# Patient Record
Sex: Female | Born: 1970 | Race: Black or African American | Hispanic: No | Marital: Single | Smoking: Current every day smoker
Health system: Southern US, Community
[De-identification: ages and names within clinical notes are randomized; demographics above are authoritative.]

## PROBLEM LIST (undated history)

## (undated) ENCOUNTER — Ambulatory Visit: Payer: Self-pay

## (undated) DIAGNOSIS — N921 Excessive and frequent menstruation with irregular cycle: Secondary | ICD-10-CM

## (undated) DIAGNOSIS — R030 Elevated blood-pressure reading, without diagnosis of hypertension: Secondary | ICD-10-CM

## (undated) DIAGNOSIS — G43909 Migraine, unspecified, not intractable, without status migrainosus: Secondary | ICD-10-CM

## (undated) DIAGNOSIS — L0292 Furuncle, unspecified: Secondary | ICD-10-CM

## (undated) DIAGNOSIS — K649 Unspecified hemorrhoids: Secondary | ICD-10-CM

## (undated) DIAGNOSIS — Z30017 Encounter for initial prescription of implantable subdermal contraceptive: Principal | ICD-10-CM

## (undated) DIAGNOSIS — D649 Anemia, unspecified: Secondary | ICD-10-CM

## (undated) DIAGNOSIS — A599 Trichomoniasis, unspecified: Secondary | ICD-10-CM

## (undated) DIAGNOSIS — R894 Abnormal immunological findings in specimens from other organs, systems and tissues: Principal | ICD-10-CM

## (undated) DIAGNOSIS — Z975 Presence of (intrauterine) contraceptive device: Secondary | ICD-10-CM

## (undated) DIAGNOSIS — Z3009 Encounter for other general counseling and advice on contraception: Principal | ICD-10-CM

## (undated) HISTORY — DX: Excessive and frequent menstruation with irregular cycle: N92.1

## (undated) HISTORY — DX: Furuncle, unspecified: L02.92

## (undated) HISTORY — DX: Encounter for other general counseling and advice on contraception: Z30.09

## (undated) HISTORY — DX: Elevated blood-pressure reading, without diagnosis of hypertension: R03.0

## (undated) HISTORY — DX: Anemia, unspecified: D64.9

## (undated) HISTORY — PX: MOUTH SURGERY: SHX715

## (undated) HISTORY — DX: Encounter for initial prescription of implantable subdermal contraceptive: Z30.017

## (undated) HISTORY — DX: Abnormal immunological findings in specimens from other organs, systems and tissues: R89.4

## (undated) HISTORY — DX: Trichomoniasis, unspecified: A59.9

## (undated) HISTORY — DX: Presence of (intrauterine) contraceptive device: Z97.5

## (undated) HISTORY — PX: OTHER SURGICAL HISTORY: SHX169

---

## 2007-08-24 ENCOUNTER — Emergency Department (HOSPITAL_COMMUNITY): Admission: EM | Admit: 2007-08-24 | Discharge: 2007-08-24 | Payer: Self-pay | Admitting: Emergency Medicine

## 2011-06-14 LAB — CBC
HCT: 33.6 — ABNORMAL LOW
Hemoglobin: 11 — ABNORMAL LOW
WBC: 5.4

## 2011-06-14 LAB — DIFFERENTIAL
Eosinophils Relative: 1
Lymphocytes Relative: 30
Lymphs Abs: 1.6
Monocytes Absolute: 0.5

## 2011-06-14 LAB — URINALYSIS, ROUTINE W REFLEX MICROSCOPIC
Bilirubin Urine: NEGATIVE
Nitrite: NEGATIVE
Specific Gravity, Urine: 1.01
pH: 5.5

## 2011-06-14 LAB — HCG, QUANTITATIVE, PREGNANCY: hCG, Beta Chain, Quant, S: 327 — ABNORMAL HIGH

## 2011-06-14 LAB — PREGNANCY, URINE: Preg Test, Ur: POSITIVE

## 2011-06-14 LAB — URINE MICROSCOPIC-ADD ON

## 2011-06-14 LAB — WET PREP, GENITAL: Yeast Wet Prep HPF POC: NONE SEEN

## 2011-09-07 ENCOUNTER — Emergency Department (HOSPITAL_COMMUNITY)
Admission: EM | Admit: 2011-09-07 | Discharge: 2011-09-07 | Disposition: A | Discharge status: Home / Self Care | Payer: Self-pay | Attending: Emergency Medicine | Admitting: Emergency Medicine

## 2011-09-07 DIAGNOSIS — F17200 Nicotine dependence, unspecified, uncomplicated: Secondary | ICD-10-CM | POA: Insufficient documentation

## 2011-09-07 DIAGNOSIS — L0291 Cutaneous abscess, unspecified: Secondary | ICD-10-CM

## 2011-09-07 DIAGNOSIS — L03319 Cellulitis of trunk, unspecified: Secondary | ICD-10-CM | POA: Insufficient documentation

## 2011-09-07 DIAGNOSIS — L02219 Cutaneous abscess of trunk, unspecified: Secondary | ICD-10-CM | POA: Insufficient documentation

## 2011-09-07 DIAGNOSIS — R109 Unspecified abdominal pain: Secondary | ICD-10-CM | POA: Insufficient documentation

## 2011-09-07 MED ORDER — OXYCODONE-ACETAMINOPHEN 5-325 MG PO TABS: 1.0000 | ORAL_TABLET | Freq: Four times a day (QID) | ORAL | Status: AC | PRN

## 2011-09-07 MED ORDER — PROMETHAZINE HCL 12.5 MG PO TABS
12.5000 mg | ORAL_TABLET | Freq: Once | ORAL | Status: AC
  Administered 2011-09-07: 12.5 mg via ORAL
  Filled 2011-09-07: qty 1

## 2011-09-07 MED ORDER — IBUPROFEN 800 MG PO TABS
800.0000 mg | ORAL_TABLET | Freq: Once | ORAL | Status: AC
  Administered 2011-09-07: 800 mg via ORAL
  Filled 2011-09-07: qty 1

## 2011-09-07 MED ORDER — SULFAMETHOXAZOLE-TMP DS 800-160 MG PO TABS
1.0000 | ORAL_TABLET | Freq: Once | ORAL | Status: AC
  Administered 2011-09-07: 1 via ORAL
  Filled 2011-09-07: qty 1

## 2011-09-07 MED ORDER — HYDROCODONE-ACETAMINOPHEN 5-325 MG PO TABS
2.0000 | ORAL_TABLET | Freq: Once | ORAL | Status: AC
  Administered 2011-09-07: 2 via ORAL
  Filled 2011-09-07: qty 2

## 2011-09-07 MED ORDER — AMOXICILLIN-POT CLAVULANATE 875-125 MG PO TABS
1.0000 | ORAL_TABLET | Freq: Once | ORAL | Status: AC
  Administered 2011-09-07: 1 via ORAL
  Filled 2011-09-07: qty 1

## 2011-09-07 MED ORDER — SULFAMETHOXAZOLE-TRIMETHOPRIM 800-160 MG PO TABS: ORAL_TABLET | ORAL | Status: DC

## 2011-09-07 MED ORDER — BUPIVACAINE HCL (PF) 0.5 % IJ SOLN
INTRAMUSCULAR | Status: AC
  Administered 2011-09-07: 150 mg
  Filled 2011-09-07: qty 30

## 2011-09-07 NOTE — ED Provider Notes (Signed)
History     CSN: [id number]  Arrival date & time 09/07/11  1535   First MD Initiated Contact with Patient 09/07/11 1736      Chief Complaint  Patient presents with  . Abscess    (Consider location/radiation/quality/duration/timing/severity/associated sxs/prior treatment) Patient is a 40 y.o. female presenting with abscess. The history is provided by the patient.  Abscess  This is a new problem. The current episode started less than one week ago. The onset was gradual. The problem has been gradually worsening. Affected Location: Pubis. The abscess is characterized by redness and painfulness. It is unknown what she was exposed to. Pertinent negatives include no fever, no vomiting and no cough. There were no sick contacts. She has received no recent medical care.    History reviewed. No pertinent past medical history.  History reviewed. No pertinent past surgical history.  No family history on file.  History  Substance Use Topics  . Smoking status: Current Everyday Smoker -- 0.5 packs/day  . Smokeless tobacco: Not on file  . Alcohol Use: Yes     occ    OB History    Grav Para Term Preterm Abortions TAB SAB Ect Mult Living                  Review of Systems  Constitutional: Negative for fever and activity change.       All ROS Neg except as noted in HPI  HENT: Negative for nosebleeds and neck pain.   Eyes: Negative for photophobia and discharge.  Respiratory: Negative for cough, shortness of breath and wheezing.   Cardiovascular: Negative for chest pain and palpitations.  Gastrointestinal: Negative for vomiting, abdominal pain and blood in stool.  Genitourinary: Negative for dysuria, frequency and hematuria.  Musculoskeletal: Negative for back pain and arthralgias.  Skin: Negative.        Abscess  Neurological: Negative for dizziness, seizures and speech difficulty.  Psychiatric/Behavioral: Negative for hallucinations and confusion.    Allergies  Review of  patient's allergies indicates no known allergies.  Home Medications  No current outpatient prescriptions on file.  BP 140/93  Pulse 93  Temp(Src) 98.4 F (36.9 C) (Oral)  Resp 20  Ht 5\' 8"  (1.727 m)  Wt 118 lb (53.524 kg)  BMI 17.94 kg/m2  SpO2 100%  LMP 08/22/2011  Physical Exam  Nursing note and vitals reviewed. Constitutional: She is oriented to person, place, and time. She appears well-developed and well-nourished.  Non-toxic appearance.  HENT:  Head: Normocephalic.  Right Ear: Tympanic membrane and external ear normal.  Left Ear: Tympanic membrane and external ear normal.  Eyes: EOM and lids are normal. Pupils are equal, round, and reactive to light.  Neck: Normal range of motion. Neck supple. Carotid bruit is not present.  Cardiovascular: Normal rate, regular rhythm, normal heart sounds, intact distal pulses and normal pulses.   Pulmonary/Chest: Breath sounds normal. No respiratory distress.  Abdominal: Soft. Bowel sounds are normal. There is no tenderness. There is no guarding.  Genitourinary:       Moderate size abscess of the left pubis adjacent to the labia. Minimal drainage present. Mild to moderate redness, without streaking. No abscess involvement of the vaginal vault.  Musculoskeletal: Normal range of motion.  Lymphadenopathy:       Head (right side): No submandibular adenopathy present.       Head (left side): No submandibular adenopathy present.    She has no cervical adenopathy.  Neurological: She is alert and oriented to person,  place, and time. She has normal strength. No cranial nerve deficit or sensory deficit.  Skin: Skin is warm and dry.  Psychiatric: She has a normal mood and affect. Her speech is normal.    ED Course  Procedures (including critical care time)   Labs Reviewed  CULTURE, ROUTINE-ABSCESS   pulse oximetry 100% on room air. Within normal limits by my interpretation. No results found.   Dx: Abscess left pubis   MDM  I have  reviewed nursing notes, vital signs, and all appropriate lab and imaging results for this patient. Patient advised to remove packing on December 30. Prescription for Septra and Percocet given to the patient. Patient to return if any changes, problems, or concerns.       Kathie Dike, [place] 09/07/11 [phone]

## 2011-09-07 NOTE — ED Notes (Signed)
Pt presents with abscess to left side of groin. Pt states it started 1 week ago.

## 2011-09-07 NOTE — ED Notes (Signed)
Pt presents with a large abscess on the left labia/groin. Pt states has been "growing x 5 days", has tried warm compresses and boil ease without success." Pt states pain level is 10/10. Large red and hardened area noted on left groin and into the labia.

## 2011-09-08 NOTE — ED Provider Notes (Signed)
Medical screening examination/treatment/procedure(s) were performed by non-physician practitioner and as supervising physician I was immediately available for consultation/collaboration.  Britney Captain R. Jamire Shabazz, MD 09/08/11 0052 

## 2011-09-11 LAB — CULTURE, ROUTINE-ABSCESS

## 2011-09-13 ENCOUNTER — Emergency Department (HOSPITAL_COMMUNITY): Payer: Self-pay

## 2011-09-13 ENCOUNTER — Encounter (HOSPITAL_COMMUNITY): Payer: Self-pay | Admitting: *Deleted

## 2011-09-13 ENCOUNTER — Emergency Department (HOSPITAL_COMMUNITY)
Admission: EM | Admit: 2011-09-13 | Discharge: 2011-09-13 | Disposition: A | Discharge status: Home / Self Care | Payer: Self-pay | Attending: Emergency Medicine | Admitting: Emergency Medicine

## 2011-09-13 DIAGNOSIS — R109 Unspecified abdominal pain: Secondary | ICD-10-CM | POA: Insufficient documentation

## 2011-09-13 DIAGNOSIS — Z136 Encounter for screening for cardiovascular disorders: Secondary | ICD-10-CM | POA: Insufficient documentation

## 2011-09-13 DIAGNOSIS — R05 Cough: Secondary | ICD-10-CM | POA: Insufficient documentation

## 2011-09-13 DIAGNOSIS — K625 Hemorrhage of anus and rectum: Secondary | ICD-10-CM | POA: Insufficient documentation

## 2011-09-13 DIAGNOSIS — K921 Melena: Secondary | ICD-10-CM | POA: Insufficient documentation

## 2011-09-13 DIAGNOSIS — F17200 Nicotine dependence, unspecified, uncomplicated: Secondary | ICD-10-CM | POA: Insufficient documentation

## 2011-09-13 DIAGNOSIS — R059 Cough, unspecified: Secondary | ICD-10-CM | POA: Insufficient documentation

## 2011-09-13 LAB — URINALYSIS, ROUTINE W REFLEX MICROSCOPIC
Bilirubin Urine: NEGATIVE
Glucose, UA: NEGATIVE mg/dL
Hgb urine dipstick: NEGATIVE
Ketones, ur: NEGATIVE mg/dL
Leukocytes, UA: NEGATIVE
Nitrite: NEGATIVE
Protein, ur: NEGATIVE mg/dL
Specific Gravity, Urine: 1.025 (ref 1.005–1.030)
Urobilinogen, UA: 0.2 mg/dL (ref 0.0–1.0)
pH: 6.5 (ref 5.0–8.0)

## 2011-09-13 LAB — CBC
HCT: 31.7 % — ABNORMAL LOW (ref 36.0–46.0)
MCH: 24.7 pg — ABNORMAL LOW (ref 26.0–34.0)
MCV: 76 fL — ABNORMAL LOW (ref 78.0–100.0)
RBC: 4.17 MIL/uL (ref 3.87–5.11)
WBC: 6.9 10*3/uL (ref 4.0–10.5)

## 2011-09-13 LAB — BASIC METABOLIC PANEL WITH GFR
BUN: 11 mg/dL (ref 6–23)
CO2: 24 meq/L (ref 19–32)
Calcium: 10 mg/dL (ref 8.4–10.5)
Chloride: 103 meq/L (ref 96–112)
Creatinine, Ser: 0.74 mg/dL (ref 0.50–1.10)
GFR calc Af Amer: >90 mL/min
GFR calc non Af Amer: >90 mL/min
Glucose, Bld: 90 mg/dL (ref 70–99)
Potassium: 3.9 meq/L (ref 3.5–5.1)
Sodium: 133 meq/L — ABNORMAL LOW (ref 135–145)

## 2011-09-13 LAB — HEPATIC FUNCTION PANEL
ALT: 16 U/L (ref 0–35)
Alkaline Phosphatase: 75 U/L (ref 39–117)
Bilirubin, Direct: <0.1 mg/dL (ref 0.0–0.3)
Total Bilirubin: 0.3 mg/dL (ref 0.3–1.2)

## 2011-09-13 LAB — LIPASE, BLOOD: Lipase: 187 U/L — ABNORMAL HIGH (ref 11–59)

## 2011-09-13 MED ORDER — SODIUM CHLORIDE 0.9 % IV BOLUS (SEPSIS)
250.0000 mL | Freq: Once | INTRAVENOUS | Status: AC
  Administered 2011-09-13: 14:00:00 via INTRAVENOUS

## 2011-09-13 MED ORDER — IOHEXOL 300 MG/ML  SOLN
100.0000 mL | Freq: Once | INTRAMUSCULAR | Status: AC | PRN
  Administered 2011-09-13: 100 mL via INTRAVENOUS

## 2011-09-13 MED ORDER — SODIUM CHLORIDE 0.9 % IV SOLN: INTRAVENOUS | Status: DC

## 2011-09-13 MED ORDER — ONDANSETRON HCL 4 MG/2ML IJ SOLN
4.0000 mg | Freq: Once | INTRAMUSCULAR | Status: AC
Start: 2011-09-13 — End: 2011-09-13
  Administered 2011-09-13: 4 mg via INTRAVENOUS
  Filled 2011-09-13: qty 2

## 2011-09-13 NOTE — ED Notes (Signed)
Pt states she has always had a problem with constipation. States  Her last normal bm was last saturday

## 2011-09-13 NOTE — ED Provider Notes (Addendum)
Scribed for Shelda Jakes, MD, the patient was seen in room [facility]18[facility] . This chart was scribed by Ellie Lunch.    CSN: [id number]  Arrival date & time 09/13/11  1211   First MD Initiated Contact with Patient 09/13/11 1250      Chief Complaint  Patient presents with  . Rectal Bleeding    (Consider location/radiation/quality/duration/timing/severity/associated sxs/prior treatment) HPI Sheila Berg is a 41 y.o. female who presents to the Emergency Department complaining of 1 episode of rectal bleeding this morning. Pt went to have a BM this morning and says the toilet bowl filled with bright red blood. Pt has not had any BM since. Pt denies any pain, vaginal bleeding or hematura. This problem is recurrent.  Pt says she has rectal bleeding once a month the past year. Bleeding typically lasts all day and then resolves. Pt has not seen a Dr for rectal bleeding before.  Denies HA, SOB, neck pain, chest pain, acute back pain, or rash.    History reviewed. No pertinent past medical history.  History reviewed. No pertinent past surgical history.  No family history on file.  History  Substance Use Topics  . Smoking status: Current Everyday Smoker -- 0.5 packs/day  . Smokeless tobacco: Not on file  . Alcohol Use: Yes     occ    Review of Systems  Constitutional: Negative for fever and chills.       10 Systems reviewed and are negative for acute change except as noted in the HPI.  HENT: Negative for congestion and neck pain.   Eyes: Negative for discharge and redness.  Respiratory: Negative for cough and shortness of breath.   Cardiovascular: Negative for chest pain.  Gastrointestinal: Positive for blood in stool. Negative for nausea, vomiting, abdominal pain, diarrhea and rectal pain.  Genitourinary: Negative for dysuria, hematuria and vaginal bleeding.  Musculoskeletal: Negative for back pain.  Skin: Negative for rash.  Neurological: Negative for syncope and headaches.    Psychiatric/Behavioral: Negative for hallucinations and confusion.  All other systems reviewed and are negative.    Allergies  Review of patient's allergies indicates no known allergies.  Home Medications   Current Outpatient Rx  Name Route Sig Dispense Refill  . OXYCODONE-ACETAMINOPHEN 5-325 MG PO TABS Oral Take 1 tablet by mouth every 6 (six) hours as needed for pain. 20 tablet 0  . SULFAMETHOXAZOLE-TRIMETHOPRIM 800-160 MG PO TABS  1 by mouth twice a day with food 14 tablet 0    BP 119/81  Pulse 80  Temp(Src) 99 F (37.2 C) (Oral)  Resp 20  Ht 5\' 8"  (1.727 m)  Wt 120 lb (54.432 kg)  BMI 18.25 kg/m2  SpO2 100%  LMP 08/22/2011  Physical Exam  Nursing note and vitals reviewed. Constitutional: She is oriented to person, place, and time. She appears well-developed and well-nourished. No distress.  HENT:  Head: Normocephalic and atraumatic.  Eyes: Conjunctivae and EOM are normal.  Neck: Neck supple.  Cardiovascular: Normal rate, regular rhythm and normal heart sounds.   Pulmonary/Chest: Effort normal and breath sounds normal. No respiratory distress.  Abdominal: Soft. Bowel sounds are normal. There is no tenderness.  Musculoskeletal: Normal range of motion. She exhibits no tenderness.  Neurological: She is alert and oriented to person, place, and time. No cranial nerve deficit. Coordination normal.  Skin: Skin is warm and dry.       Healing abscess on pubis.   Psychiatric: She has a normal mood and affect.    ED  Course  Procedures (including critical care time)  DIAGNOSTIC STUDIES: Oxygen Saturation is 100% on room air, normal by my interpretation.    COORDINATION OF CARE:   Date: 09/13/2011  Rate: 64  Rhythm: normal sinus rhythm  QRS Axis: normal  Intervals: normal  ST/T Wave abnormalities: normal  Conduction Disutrbances:none  Narrative Interpretation:   Old EKG Reviewed: none available  Results for orders placed during the hospital encounter of 09/13/11   CBC      Component Value Range   WBC 6.9  4.0 - 10.5 (K/uL)   RBC 4.17  3.87 - 5.11 (MIL/uL)   Hemoglobin 10.3 (*) 12.0 - 15.0 (g/dL)   HCT 16.1 (*) 09.6 - 46.0 (%)   MCV 76.0 (*) 78.0 - 100.0 (fL)   MCH 24.7 (*) 26.0 - 34.0 (pg)   MCHC 32.5  30.0 - 36.0 (g/dL)   RDW 04.5 (*) 40.9 - 15.5 (%)   Platelets 315  150 - 400 (K/uL)  BASIC METABOLIC PANEL      Component Value Range   Sodium 133 (*) 135 - 145 (mEq/L)   Potassium 3.9  3.5 - 5.1 (mEq/L)   Chloride 103  96 - 112 (mEq/L)   CO2 24  19 - 32 (mEq/L)   Glucose, Bld 90  70 - 99 (mg/dL)   BUN 11  6 - 23 (mg/dL)   Creatinine, Ser 8.11  0.50 - 1.10 (mg/dL)   Calcium 91.4  8.4 - 10.5 (mg/dL)   GFR calc non Af Amer >90  >90 (mL/min)   GFR calc Af Amer >90  >90 (mL/min)  HEPATIC FUNCTION PANEL      Component Value Range   Total Protein 7.7  6.0 - 8.3 (g/dL)   Albumin 3.7  3.5 - 5.2 (g/dL)   AST 18  0 - 37 (U/L)   ALT 16  0 - 35 (U/L)   Alkaline Phosphatase 75  39 - 117 (U/L)   Total Bilirubin 0.3  0.3 - 1.2 (mg/dL)   Bilirubin, Direct PENDING  0.0 - 0.3 (mg/dL)   Indirect Bilirubin PENDING  0.3 - 0.9 (mg/dL)  LIPASE, BLOOD      Component Value Range   Lipase 187 (*) 11 - 59 (U/L)  URINALYSIS, ROUTINE W REFLEX MICROSCOPIC      Component Value Range   Color, Urine YELLOW  YELLOW    APPearance CLEAR  CLEAR    Specific Gravity, Urine 1.025  1.005 - 1.030    pH 6.5  5.0 - 8.0    Glucose, UA NEGATIVE  NEGATIVE (mg/dL)   Hgb urine dipstick NEGATIVE  NEGATIVE    Bilirubin Urine NEGATIVE  NEGATIVE    Ketones, ur NEGATIVE  NEGATIVE (mg/dL)   Protein, ur NEGATIVE  NEGATIVE (mg/dL)   Urobilinogen, UA 0.2  0.0 - 1.0 (mg/dL)   Nitrite NEGATIVE  NEGATIVE    Leukocytes, UA NEGATIVE  NEGATIVE     Results for orders placed during the hospital encounter of 09/13/11  CBC      Component Value Range   WBC 6.9  4.0 - 10.5 (K/uL)   RBC 4.17  3.87 - 5.11 (MIL/uL)   Hemoglobin 10.3 (*) 12.0 - 15.0 (g/dL)   HCT 78.2 (*) 95.6 - 46.0 (%)    MCV 76.0 (*) 78.0 - 100.0 (fL)   MCH 24.7 (*) 26.0 - 34.0 (pg)   MCHC 32.5  30.0 - 36.0 (g/dL)   RDW 21.3 (*) 08.6 - 15.5 (%)   Platelets 315  150 -  400 (K/uL)  BASIC METABOLIC PANEL      Component Value Range   Sodium 133 (*) 135 - 145 (mEq/L)   Potassium 3.9  3.5 - 5.1 (mEq/L)   Chloride 103  96 - 112 (mEq/L)   CO2 24  19 - 32 (mEq/L)   Glucose, Bld 90  70 - 99 (mg/dL)   BUN 11  6 - 23 (mg/dL)   Creatinine, Ser 1.61  0.50 - 1.10 (mg/dL)   Calcium 09.6  8.4 - 10.5 (mg/dL)   GFR calc non Af Amer >90  >90 (mL/min)   GFR calc Af Amer >90  >90 (mL/min)  HEPATIC FUNCTION PANEL      Component Value Range   Total Protein 7.7  6.0 - 8.3 (g/dL)   Albumin 3.7  3.5 - 5.2 (g/dL)   AST 18  0 - 37 (U/L)   ALT 16  0 - 35 (U/L)   Alkaline Phosphatase 75  39 - 117 (U/L)   Total Bilirubin 0.3  0.3 - 1.2 (mg/dL)   Bilirubin, Direct PENDING  0.0 - 0.3 (mg/dL)   Indirect Bilirubin PENDING  0.3 - 0.9 (mg/dL)  LIPASE, BLOOD      Component Value Range   Lipase 187 (*) 11 - 59 (U/L)  URINALYSIS, ROUTINE W REFLEX MICROSCOPIC      Component Value Range   Color, Urine YELLOW  YELLOW    APPearance CLEAR  CLEAR    Specific Gravity, Urine 1.025  1.005 - 1.030    pH 6.5  5.0 - 8.0    Glucose, UA NEGATIVE  NEGATIVE (mg/dL)   Hgb urine dipstick NEGATIVE  NEGATIVE    Bilirubin Urine NEGATIVE  NEGATIVE    Ketones, ur NEGATIVE  NEGATIVE (mg/dL)   Protein, ur NEGATIVE  NEGATIVE (mg/dL)   Urobilinogen, UA 0.2  0.0 - 1.0 (mg/dL)   Nitrite NEGATIVE  NEGATIVE    Leukocytes, UA NEGATIVE  NEGATIVE    Dg Chest 2 View  09/13/2011  *RADIOLOGY REPORT*  Clinical Data: Worsening productive cough.  Smoker  CHEST - 2 VIEW  Comparison: None.  Findings: Heart and mediastinal contours are within normal limits. The lung fields are clear with no signs of focal infiltrate or congestive failure.  No pleural fluid or significant peribronchial cuffing is seen.  Bony structures appear intact.  IMPRESSION: Normal chest with no  worrisome focal or acute abnormality identified.  Original Report Authenticated By: Bertha Stakes, M.D.   Results for orders placed during the hospital encounter of 09/13/11  CBC      Component Value Range   WBC 6.9  4.0 - 10.5 (K/uL)   RBC 4.17  3.87 - 5.11 (MIL/uL)   Hemoglobin 10.3 (*) 12.0 - 15.0 (g/dL)   HCT 04.5 (*) 40.9 - 46.0 (%)   MCV 76.0 (*) 78.0 - 100.0 (fL)   MCH 24.7 (*) 26.0 - 34.0 (pg)   MCHC 32.5  30.0 - 36.0 (g/dL)   RDW 81.1 (*) 91.4 - 15.5 (%)   Platelets 315  150 - 400 (K/uL)  BASIC METABOLIC PANEL      Component Value Range   Sodium 133 (*) 135 - 145 (mEq/L)   Potassium 3.9  3.5 - 5.1 (mEq/L)   Chloride 103  96 - 112 (mEq/L)   CO2 24  19 - 32 (mEq/L)   Glucose, Bld 90  70 - 99 (mg/dL)   BUN 11  6 - 23 (mg/dL)   Creatinine, Ser 7.82  0.50 - 1.10 (  mg/dL)   Calcium 16.1  8.4 - 10.5 (mg/dL)   GFR calc non Af Amer >90  >90 (mL/min)   GFR calc Af Amer >90  >90 (mL/min)  HEPATIC FUNCTION PANEL      Component Value Range   Total Protein 7.7  6.0 - 8.3 (g/dL)   Albumin 3.7  3.5 - 5.2 (g/dL)   AST 18  0 - 37 (U/L)   ALT 16  0 - 35 (U/L)   Alkaline Phosphatase 75  39 - 117 (U/L)   Total Bilirubin 0.3  0.3 - 1.2 (mg/dL)   Bilirubin, Direct <0.9  0.0 - 0.3 (mg/dL)   Indirect Bilirubin NOT CALCULATED  0.3 - 0.9 (mg/dL)  LIPASE, BLOOD      Component Value Range   Lipase 187 (*) 11 - 59 (U/L)  URINALYSIS, ROUTINE W REFLEX MICROSCOPIC      Component Value Range   Color, Urine YELLOW  YELLOW    APPearance CLEAR  CLEAR    Specific Gravity, Urine 1.025  1.005 - 1.030    pH 6.5  5.0 - 8.0    Glucose, UA NEGATIVE  NEGATIVE (mg/dL)   Hgb urine dipstick NEGATIVE  NEGATIVE    Bilirubin Urine NEGATIVE  NEGATIVE    Ketones, ur NEGATIVE  NEGATIVE (mg/dL)   Protein, ur NEGATIVE  NEGATIVE (mg/dL)   Urobilinogen, UA 0.2  0.0 - 1.0 (mg/dL)   Nitrite NEGATIVE  NEGATIVE    Leukocytes, UA NEGATIVE  NEGATIVE    Dg Chest 2 View  09/13/2011  *RADIOLOGY REPORT*  Clinical  Data: Worsening productive cough.  Smoker  CHEST - 2 VIEW  Comparison: None.  Findings: Heart and mediastinal contours are within normal limits. The lung fields are clear with no signs of focal infiltrate or congestive failure.  No pleural fluid or significant peribronchial cuffing is seen.  Bony structures appear intact.  IMPRESSION: Normal chest with no worrisome focal or acute abnormality identified.  Original Report Authenticated By: Bertha Stakes, M.D.   Ct Abdomen Pelvis W Contrast  09/13/2011  *RADIOLOGY REPORT*  Clinical Data: Abdominal pain and rectal bleeding. History of Crohn's disease.  CT ABDOMEN AND PELVIS WITH CONTRAST  Technique:  Multidetector CT imaging of the abdomen and pelvis was performed following the standard protocol during bolus administration of intravenous contrast.  Contrast:  100 ml Omnipaque-300.  Comparison: None  Findings: The lung bases are clear.  The solid abdominal organs are unremarkable.  The gallbladder is normal.  No common bile duct dilatation.  The stomach is unremarkable.  There is mild distention of the duodenum.  Mild distention of some of the small bowel loops in the right lower quadrant but no findings for obstruction.  No obvious bowel wall thickening or abnormal enhancement.  The terminal ileum is normal.  There is mild swirling of mesenteric vessels in the root of the small bowel mesentery.  The duodenum swings back across the midline.  This is likely due to a loose mesenteric attachment but no volvulus.  Upper GI/small bowel follow-through examination may be helpful for further evaluation (non urgent). The appendix is normal.  No free abdominal/pelvic fluid collections are identified.  Mild distention of the bladder.  The uterus and ovaries are grossly normal.  There is a slightly complex, likely collapsing cyst associated the left ovary.  The sigmoid colon is decompressed.  The ascending and transverse colons are unremarkable.  No abdominal/pelvic  lymphadenopathy.  The aorta is normal in caliber. The major branch vessels  are normal.  A circumaortic left renal vein is noted.  The bony structures are unremarkable.  IMPRESSION:  1.  Mildly dilated small bowel loops in the right abdomen without findings for obstruction or obvious changes of Crohn's inflammation. 2.  Swirling of the small bowel mesentery likely due to a loose mesenteric attachment.  No volvulus or obstruction. Upper GI and small-bowel follow-through examination may be helpful for further evaluation (non urgent). 3. No other significant abdominal/pelvic findings.  Original Report Authenticated By: P. Loralie Champagne, M.D.       ED MEDICATIONS Medications  0.9 %  sodium chloride infusion   sodium chloride 0.9 % bolus 250 mL   ondansetron (ZOFRAN) injection 4 mg    1. Rectal bleeding      MDM  Patient with history of rectal bleeding occurring at least once a month lasting throughout most of the day for several months now. One episode today of a bowel movement with all blood in it no bowel movements since that time. Patient has not had followup with GI for this. Not related to vaginal bleeding no vomiting with blood no significant abdominal pain. Lipase is elevated at 187 CT scan is pending to further evaluate this along with a chest x-ray. H&H today no significant change from several months ago but slightly lower with hemoglobin of 10.3. His CT not to finding will referred to GI medicine for colonoscopy and further evaluation do not feel patient needs admission for the elevated lipase and she is fairly asymptomatic currently. Urology bleeding could be internal hemorrhoids but does and she is now 41 years of age further evaluation is appropriate and important.  I personally performed the services described in this documentation, which was scribed in my presence. The recorded information has been reviewed and considered.  CT scan results noted in chart review on 09/14/11. Patient was  turned over to Dr Colon Branch for disposition.        Shelda Jakes, MD 09/13/11 1536  Shelda Jakes, MD 09/14/11 2044

## 2011-09-13 NOTE — Progress Notes (Signed)
Assumed care/disposition of patient. She presented with rectal bleeding. She has had intermittent rectal bleeding for over a year. Has not had the opportunity to see GI. CT negative for acute findings. Labs with elevated lipase with no other symptoms. Referral to Dr. Karilyn Cota for follow up. Dg Chest 2 View  09/13/2011  *RADIOLOGY REPORT*  Clinical Data: Worsening productive cough.  Smoker  CHEST - 2 VIEW  Comparison: None.  Findings: Heart and mediastinal contours are within normal limits. The lung fields are clear with no signs of focal infiltrate or congestive failure.  No pleural fluid or significant peribronchial cuffing is seen.  Bony structures appear intact.  IMPRESSION: Normal chest with no worrisome focal or acute abnormality identified.  Original Report Authenticated By: Bertha Stakes, M.D.   Ct Abdomen Pelvis W Contrast  09/13/2011  *RADIOLOGY REPORT*  Clinical Data: Abdominal pain and rectal bleeding. History of Crohn's disease.  CT ABDOMEN AND PELVIS WITH CONTRAST  Technique:  Multidetector CT imaging of the abdomen and pelvis was performed following the standard protocol during bolus administration of intravenous contrast.  Contrast:  100 ml Omnipaque-300.  Comparison: None  Findings: The lung bases are clear.  The solid abdominal organs are unremarkable.  The gallbladder is normal.  No common bile duct dilatation.  The stomach is unremarkable.  There is mild distention of the duodenum.  Mild distention of some of the small bowel loops in the right lower quadrant but no findings for obstruction.  No obvious bowel wall thickening or abnormal enhancement.  The terminal ileum is normal.  There is mild swirling of mesenteric vessels in the root of the small bowel mesentery.  The duodenum swings back across the midline.  This is likely due to a loose mesenteric attachment but no volvulus.  Upper GI/small bowel follow-through examination may be helpful for further evaluation (non urgent). The appendix  is normal.  No free abdominal/pelvic fluid collections are identified.  Mild distention of the bladder.  The uterus and ovaries are grossly normal.  There is a slightly complex, likely collapsing cyst associated the left ovary.  The sigmoid colon is decompressed.  The ascending and transverse colons are unremarkable.  No abdominal/pelvic lymphadenopathy.  The aorta is normal in caliber. The major branch vessels are normal.  A circumaortic left renal vein is noted.  The bony structures are unremarkable.  IMPRESSION:  1.  Mildly dilated small bowel loops in the right abdomen without findings for obstruction or obvious changes of Crohn's inflammation. 2.  Swirling of the small bowel mesentery likely due to a loose mesenteric attachment.  No volvulus or obstruction. Upper GI and small-bowel follow-through examination may be helpful for further evaluation (non urgent). 3. No other significant abdominal/pelvic findings.  Original Report Authenticated By: P. Loralie Champagne, M.D.

## 2011-09-13 NOTE — ED Notes (Signed)
edp in with pt 

## 2011-09-13 NOTE — ED Notes (Signed)
Pt states that she "went to have a bowel movement and didn't see anything but blood", described as bright red in color. NAD. Occurred x 1 this morning.

## 2011-09-13 NOTE — ED Notes (Signed)
Patient is resting comfortably. 

## 2011-09-13 NOTE — Discharge Instructions (Signed)
Bloody Stools Bloody stools means there is blood in your poop (stool). It is a sign that there is a problem somewhere in the digestive system. It is important for your doctor to find the cause of your bleeding, so the problem can be treated.  HOME CARE  Only take medicine as told by your doctor.   Eat foods with fiber (prunes, bran cereals).   Drink enough fluids to keep your pee (urine) clear or pale yellow.   Sit in warm water (sitz bath) for 10 to 15 minutes as told by your doctor.   Know how to take your medicines (enemas, suppositories) if advised by your doctor.   Watch for signs that you are getting better or getting worse.  GET HELP RIGHT AWAY IF:   You are not getting better.   You start to get better but then get worse again.   You have new problems.   You have severe bleeding from the place where poop comes out (rectum) that does not stop.   You throw up (vomit) blood.   You feel weak or pass out (faint).   You have a fever.  MAKE SURE YOU:   Understand these instructions.   Will watch your condition.   Will get help right away if you are not doing well or get worse.  Document Released: 08/14/2009 Document Revised: 05/08/2011 Document Reviewed: 01/11/2011 [facility] Patient Information 2012 [place], [place].

## 2011-11-19 ENCOUNTER — Emergency Department (HOSPITAL_COMMUNITY): Payer: No Typology Code available for payment source

## 2011-11-19 ENCOUNTER — Emergency Department (HOSPITAL_COMMUNITY)
Admission: EM | Admit: 2011-11-19 | Discharge: 2011-11-19 | Disposition: A | Discharge status: Home / Self Care | Payer: No Typology Code available for payment source | Attending: Emergency Medicine | Admitting: Emergency Medicine

## 2011-11-19 ENCOUNTER — Encounter (HOSPITAL_COMMUNITY): Payer: Self-pay | Admitting: Cardiology

## 2011-11-19 DIAGNOSIS — R209 Unspecified disturbances of skin sensation: Secondary | ICD-10-CM | POA: Insufficient documentation

## 2011-11-19 DIAGNOSIS — M545 Low back pain, unspecified: Secondary | ICD-10-CM | POA: Insufficient documentation

## 2011-11-19 DIAGNOSIS — S335XXA Sprain of ligaments of lumbar spine, initial encounter: Secondary | ICD-10-CM | POA: Insufficient documentation

## 2011-11-19 DIAGNOSIS — F17200 Nicotine dependence, unspecified, uncomplicated: Secondary | ICD-10-CM | POA: Insufficient documentation

## 2011-11-19 DIAGNOSIS — S139XXA Sprain of joints and ligaments of unspecified parts of neck, initial encounter: Secondary | ICD-10-CM | POA: Insufficient documentation

## 2011-11-19 DIAGNOSIS — S161XXA Strain of muscle, fascia and tendon at neck level, initial encounter: Secondary | ICD-10-CM

## 2011-11-19 DIAGNOSIS — S39012A Strain of muscle, fascia and tendon of lower back, initial encounter: Secondary | ICD-10-CM

## 2011-11-19 DIAGNOSIS — M79609 Pain in unspecified limb: Secondary | ICD-10-CM | POA: Insufficient documentation

## 2011-11-19 DIAGNOSIS — M25559 Pain in unspecified hip: Secondary | ICD-10-CM | POA: Insufficient documentation

## 2011-11-19 DIAGNOSIS — M542 Cervicalgia: Secondary | ICD-10-CM | POA: Insufficient documentation

## 2011-11-19 MED ORDER — MORPHINE SULFATE 4 MG/ML IJ SOLN
4.0000 mg | Freq: Once | INTRAMUSCULAR | Status: AC
  Administered 2011-11-19: 4 mg via INTRAVENOUS
  Filled 2011-11-19: qty 1

## 2011-11-19 MED ORDER — IBUPROFEN 600 MG PO TABS: 600.0000 mg | ORAL_TABLET | Freq: Four times a day (QID) | ORAL | Status: AC | PRN

## 2011-11-19 MED ORDER — HYDROCODONE-ACETAMINOPHEN 5-500 MG PO TABS: 1.0000 | ORAL_TABLET | Freq: Four times a day (QID) | ORAL | Status: AC | PRN

## 2011-11-19 MED ORDER — ONDANSETRON HCL 4 MG/2ML IJ SOLN
4.0000 mg | Freq: Once | INTRAMUSCULAR | Status: AC
  Administered 2011-11-19: 4 mg via INTRAVENOUS
  Filled 2011-11-19: qty 2

## 2011-11-19 MED ORDER — OXYCODONE-ACETAMINOPHEN 5-325 MG PO TABS
2.0000 | ORAL_TABLET | Freq: Once | ORAL | Status: AC
  Administered 2011-11-19: 2 via ORAL
  Filled 2011-11-19: qty 2

## 2011-11-19 MED ORDER — KETOROLAC TROMETHAMINE 30 MG/ML IJ SOLN
30.0000 mg | Freq: Once | INTRAMUSCULAR | Status: AC
  Administered 2011-11-19: 30 mg via INTRAVENOUS
  Filled 2011-11-19: qty 1
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
## 2011-11-19 NOTE — ED Notes (Signed)
Dr. Oletta Lamas at the bedside to removed pt from LSB. c-collar remains on at this time.
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
## 2011-11-19 NOTE — Discharge Instructions (Signed)
Take Motrin (maximum of 2400mg  per day) or tylenol (maximum of 4000mg  per day) scheduled for 2 days then as needed for pain.  If your pain persists, take vicodin in place of your tylenol.     RESOURCE GUIDE  No Primary Care Doctor Call [facility]  [phone] Other agencies that provide inexpensive medical care    [facility]  [phone]    [facility]  [phone]    [facility]  [phone]    [facility]  [phone]    [facility]  [phone]    [facility]  [phone]   Cervical Sprain A cervical sprain is an injury in the neck in which the ligaments are stretched or torn. The ligaments are the tissues that hold the bones of the neck (vertebrae) in place.Cervical sprains can range from very mild to very severe. Most cervical sprains get better in 1 to 3 weeks, but it depends on the cause and extent of the injury. Severe cervical sprains can cause the neck vertebrae to be unstable. This can lead to damage of the spinal cord and can result in serious nervous system problems. Your caregiver will determine whether your cervical sprain is mild or severe. CAUSES  Severe cervical sprains may be caused by:  Contact sport injuries (football, rugby, wrestling, hockey, auto racing, gymnastics, diving, martial arts, boxing).   Motor vehicle collisions.   Whiplash injuries. This means the neck is forcefully whipped backward and forward.   Falls.  Mild cervical sprains may be caused by:   Awkward positions, such as cradling a telephone between your ear and shoulder.   Sitting in a chair that does not offer proper support.   Working at a poorly Marketing executive station.   Activities that require looking up or down for long periods of time.  SYMPTOMS   Pain, soreness, stiffness, or a burning sensation in the front, back, or sides of the neck. This discomfort may develop immediately after injury or it may develop slowly and not begin for 24  hours or more after an injury.   Pain or tenderness directly in the middle of the back of the neck.   Shoulder or upper back pain.   Limited ability to move the neck.   Headache.   Dizziness.   Weakness, numbness, or tingling in the hands or arms.   Muscle spasms.   Difficulty swallowing or chewing.   Tenderness and swelling of the neck.  DIAGNOSIS  Most of the time, your caregiver can diagnose this problem by taking your history and doing a physical exam. Your caregiver will ask about any known problems, such as arthritis in the neck or a previous neck injury. X-rays may be taken to find out if there are any other problems, such as problems with the bones of the neck. However, an X-ray often does not reveal the full extent of a cervical sprain. Other tests such as a computed tomography (CT) scan or magnetic resonance imaging (MRI) may be needed. TREATMENT  Treatment depends on the severity of the cervical sprain. Mild sprains can be treated with rest, keeping the neck in place (immobilization), and pain medicines. Severe cervical sprains need immediate immobilization and an appointment with an orthopedist or neurosurgeon. Several treatment options are available to help with pain, muscle spasms, and other symptoms. Your caregiver may prescribe:  Medicines, such as pain relievers, numbing medicines, or muscle relaxants.   Physical therapy. This can include stretching exercises, strengthening exercises, and  posture training. Exercises and improved posture can help stabilize the neck, strengthen muscles, and help stop symptoms from returning.   A neck collar to be worn for short periods of time. Often, these collars are worn for comfort. However, certain collars may be worn to protect the neck and prevent further worsening of a serious cervical sprain.  HOME CARE INSTRUCTIONS   Put ice on the injured area.   Put ice in a plastic bag.   Place a towel between your skin and the bag.    Leave the ice on for 15 to 20 minutes, 3 to 4 times a day.   Only take over-the-counter or prescription medicines for pain, discomfort, or fever as directed by your caregiver.   Keep all follow-up appointments as directed by your caregiver.   Keep all physical therapy appointments as directed by your caregiver.   If a neck collar is prescribed, wear it as directed by your caregiver.   Do not drive while wearing a neck collar.   Make any needed adjustments to your work station to promote good posture.   Avoid positions and activities that make your symptoms worse.   Warm up and stretch before being active to help prevent problems.  SEEK MEDICAL CARE IF:   Your pain is not controlled with medicine.   You are unable to decrease your pain medicine over time as planned.   Your activity level is not improving as expected.  SEEK IMMEDIATE MEDICAL CARE IF:   You develop any bleeding, stomach upset, or signs of an allergic reaction to your medicine.   Your symptoms get worse.   You develop new, unexplained symptoms.   You have numbness, tingling, weakness, or paralysis in any part of your body.  MAKE SURE YOU:   Understand these instructions.   Will watch your condition.   Will get help right away if you are not doing well or get worse.  Document Released: 06/23/2007 Document Revised: 08/15/2011 Document Reviewed: 05/29/2011 [facility] Patient Information 2012 [place], [place].  Motor Vehicle Collision  It is common to have multiple bruises and sore muscles after a motor vehicle collision (MVC). These tend to feel worse for the first 24 hours. You may have the most stiffness and soreness over the first several hours. You may also feel worse when you wake up the first morning after your collision. After this point, you will usually begin to improve with each day. The speed of improvement often depends on the severity of the collision, the number of injuries, and the location and  nature of these injuries. HOME CARE INSTRUCTIONS   Put ice on the injured area.   Put ice in a plastic bag.   Place a towel between your skin and the bag.   Leave the ice on for 15 to 20 minutes, 3 to 4 times a day.   Drink enough fluids to keep your urine clear or pale yellow. Do not drink alcohol.   Take a warm shower or bath once or twice a day. This will increase blood flow to sore muscles.   You may return to activities as directed by your caregiver. Be careful when lifting, as this may aggravate neck or back pain.   Only take over-the-counter or prescription medicines for pain, discomfort, or fever as directed by your caregiver. Do not use aspirin. This may increase bruising and bleeding.  SEEK IMMEDIATE MEDICAL CARE IF:  You have numbness, tingling, or weakness in the arms or legs.  You develop severe headaches not relieved with medicine.   You have severe neck pain, especially tenderness in the middle of the back of your neck.   You have changes in bowel or bladder control.   There is increasing pain in any area of the body.   You have shortness of breath, lightheadedness, dizziness, or fainting.   You have chest pain.   You feel sick to your stomach (nauseous), throw up (vomit), or sweat.   You have increasing abdominal discomfort.   There is blood in your urine, stool, or vomit.   You have pain in your shoulder (shoulder strap areas).   You feel your symptoms are getting worse.  MAKE SURE YOU:   Understand these instructions.   Will watch your condition.   Will get help right away if you are not doing well or get worse.  Document Released: 08/26/2005 Document Revised: 08/15/2011 Document Reviewed: 01/23/2011 [facility] Patient Information 2012 [place], [place].      Back Exercises Back exercises help treat and prevent back injuries. The goal of back exercises is to increase the strength of your abdominal and back muscles and the flexibility of your  back. These exercises should be started when you no longer have back pain. Back exercises include:  Pelvic Tilt. Lie on your back with your knees bent. Tilt your pelvis until the lower part of your back is against the floor. Hold this position 5 to 10 sec and repeat 5 to 10 times.   Knee to Chest. Pull first 1 knee up against your chest and hold for 20 to 30 seconds, repeat this with the other knee, and then both knees. This may be done with the other leg straight or bent, whichever feels better.   Sit-Ups or Curl-Ups. Bend your knees 90 degrees. Start with tilting your pelvis, and do a partial, slow sit-up, lifting your trunk only 30 to 45 degrees off the floor. Take at least 2 to 3 seconds for each sit-up. Do not do sit-ups with your knees out straight. If partial sit-ups are difficult, simply do the above but with only tightening your abdominal muscles and holding it as directed.   Hip-Lift. Lie on your back with your knees flexed 90 degrees. Push down with your feet and shoulders as you raise your hips a couple inches off the floor; hold for 10 seconds, repeat 5 to 10 times.   Back arches. Lie on your stomach, propping yourself up on bent elbows. Slowly press on your hands, causing an arch in your low back. Repeat 3 to 5 times. Any initial stiffness and discomfort should lessen with repetition over time.   Shoulder-Lifts. Lie face down with arms beside your body. Keep hips and torso pressed to floor as you slowly lift your head and shoulders off the floor.  Do not overdo your exercises, especially in the beginning. Exercises may cause you some mild back discomfort which lasts for a few minutes; however, if the pain is more severe, or lasts for more than 15 minutes, do not continue exercises until you see your caregiver. Improvement with exercise therapy for back problems is slow.  See your caregivers for assistance with developing a proper back exercise program. Document Released: 10/03/2004  Document Revised: 08/15/2011 Document Reviewed: 08/26/2005 [facility] Patient Information 2012 [place], [place].
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
## 2011-11-21 ENCOUNTER — Emergency Department (HOSPITAL_COMMUNITY)
Admission: EM | Admit: 2011-11-21 | Discharge: 2011-11-21 | Disposition: A | Discharge status: Home / Self Care | Payer: No Typology Code available for payment source | Attending: Emergency Medicine | Admitting: Emergency Medicine

## 2011-11-21 ENCOUNTER — Encounter (HOSPITAL_COMMUNITY): Payer: Self-pay | Admitting: Emergency Medicine

## 2011-11-21 DIAGNOSIS — M5380 Other specified dorsopathies, site unspecified: Secondary | ICD-10-CM | POA: Insufficient documentation

## 2011-11-21 DIAGNOSIS — M542 Cervicalgia: Secondary | ICD-10-CM | POA: Insufficient documentation

## 2011-11-21 DIAGNOSIS — M2550 Pain in unspecified joint: Secondary | ICD-10-CM | POA: Insufficient documentation

## 2011-11-21 DIAGNOSIS — S161XXA Strain of muscle, fascia and tendon at neck level, initial encounter: Secondary | ICD-10-CM

## 2011-11-21 DIAGNOSIS — IMO0001 Reserved for inherently not codable concepts without codable children: Secondary | ICD-10-CM | POA: Insufficient documentation

## 2011-11-21 DIAGNOSIS — S139XXA Sprain of joints and ligaments of unspecified parts of neck, initial encounter: Secondary | ICD-10-CM | POA: Insufficient documentation

## 2011-11-21 DIAGNOSIS — F17200 Nicotine dependence, unspecified, uncomplicated: Secondary | ICD-10-CM | POA: Insufficient documentation

## 2011-11-21 MED ORDER — METHOCARBAMOL 500 MG PO TABS
1000.0000 mg | ORAL_TABLET | Freq: Once | ORAL | Status: AC
  Administered 2011-11-21: 1000 mg via ORAL
  Filled 2011-11-21: qty 2

## 2011-11-21 MED ORDER — OXYCODONE-ACETAMINOPHEN 5-325 MG PO TABS: 1.0000 | ORAL_TABLET | ORAL | Status: AC | PRN

## 2011-11-21 MED ORDER — METHOCARBAMOL 500 MG PO TABS: 1000.0000 mg | ORAL_TABLET | Freq: Four times a day (QID) | ORAL | Status: AC

## 2011-11-21 MED ORDER — OXYCODONE-ACETAMINOPHEN 5-325 MG PO TABS
2.0000 | ORAL_TABLET | Freq: Once | ORAL | Status: AC
  Administered 2011-11-21: 2 via ORAL
  Filled 2011-11-21: qty 2

## 2011-11-21 NOTE — ED Notes (Signed)
c-collar not applied, soft collar not available from stock, script given for pt to get at local rx. Verbalized understanding.
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
## 2011-11-21 NOTE — Discharge Instructions (Signed)
Cervical Strain Care After A cervical strain is when the muscles and ligaments in your neck have been stretched. The bones are not broken. If you had any problems moving your arms or legs immediately after the injury, even if the problem has gone away, make sure to tell this to your caregiver.  HOME CARE INSTRUCTIONS   While awake, apply ice packs to the neck or areas of pain about every 1 to 2 hours, for 15 to 20 minutes at a time. Do this for 2 days. If you were given a cervical collar for support, ask your caregiver if you may remove it for bathing or applying ice.   If given a cervical collar, wear as instructed. Do not remove any collar unless instructed by a caregiver.   Only take over-the-counter or prescription medicines for pain, discomfort, or fever as directed by your caregiver.  Recheck with the hospital or clinic after a radiologist has read your X-rays. Recheck with the hospital or clinic to make sure the initial readings are correct. Do this also to determine if you need further studies. It is your responsibility to find out your X-ray results. X-rays are sometimes repeated in one week to ten days. These are often repeated to make sure that a hairline fracture was not overlooked. Ask your caregiver how you are to find out about your radiology (X-ray) results. SEEK IMMEDIATE MEDICAL CARE IF:   You have increasing pain in your neck.   You develop difficulties swallowing or breathing.   You have numbness, weakness, or movement problems in the arms or legs.   You have difficulty walking.   You develop bowel or bladder retention or incontinence.   You have problems with walking.  MAKE SURE YOU:   Understand these instructions.   Will watch your condition.   Will get help right away if you are not doing well or get worse.  Document Released: 08/26/2005 Document Revised: 05/08/2011 Document Reviewed: 04/08/2008 [facility] Patient Information 2012 [place], [place].  Use the  medicines prescribed,  Taking oxycodone in place of your hydrocodone which may give better pain relief.  Start using heat therapy to your neck and shoulder area, 20 minutes 3-4 times daily followed by gentle range of motion as discussed in order to gradually improve the range of motion of your neck.  Use the soft collar for comfort only, do not rely on this for constant use as you don't want to weaken your neck muscles.  Call Dr. Megan Mans for a recheck and to establish primary medical care.  Expect gradual improvement in her symptoms over the next week.  Do not drive within 4 hours of taking oxycodone as this medication will make you drowsy.

## 2011-11-21 NOTE — ED Notes (Signed)
Idol pa to see pt 

## 2011-11-22 NOTE — ED Provider Notes (Signed)
Medical screening examination/treatment/procedure(s) were performed by non-physician practitioner and as supervising physician I was immediately available for consultation/collaboration.   Geoffery Lyons, MD 11/22/11 [phone]
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
## 2011-12-02 ENCOUNTER — Other Ambulatory Visit (HOSPITAL_COMMUNITY): Payer: Self-pay | Admitting: Preventative Medicine

## 2011-12-02 DIAGNOSIS — IMO0001 Reserved for inherently not codable concepts without codable children: Secondary | ICD-10-CM

## 2011-12-03 ENCOUNTER — Ambulatory Visit (HOSPITAL_COMMUNITY)
Admission: RE | Admit: 2011-12-03 | Discharge: 2011-12-03 | Disposition: A | Discharge status: Home / Self Care | Payer: No Typology Code available for payment source | Source: Ambulatory Visit | Attending: Preventative Medicine | Admitting: Preventative Medicine

## 2011-12-03 DIAGNOSIS — M5020 Other cervical disc displacement, unspecified cervical region: Secondary | ICD-10-CM | POA: Insufficient documentation

## 2011-12-03 DIAGNOSIS — M542 Cervicalgia: Secondary | ICD-10-CM | POA: Insufficient documentation

## 2011-12-03 DIAGNOSIS — IMO0001 Reserved for inherently not codable concepts without codable children: Secondary | ICD-10-CM

## 2014-08-07 ENCOUNTER — Emergency Department (HOSPITAL_COMMUNITY)
Admission: EM | Admit: 2014-08-07 | Discharge: 2014-08-07 | Disposition: A | Discharge status: Home / Self Care | Payer: No Typology Code available for payment source | Attending: Emergency Medicine | Admitting: Emergency Medicine

## 2014-08-07 ENCOUNTER — Encounter (HOSPITAL_COMMUNITY): Payer: Self-pay

## 2014-08-07 DIAGNOSIS — L0231 Cutaneous abscess of buttock: Secondary | ICD-10-CM

## 2014-08-07 DIAGNOSIS — Z720 Tobacco use: Secondary | ICD-10-CM | POA: Insufficient documentation

## 2014-08-07 MED ORDER — SULFAMETHOXAZOLE-TRIMETHOPRIM 800-160 MG PO TABS: 1.0000 | ORAL_TABLET | Freq: Two times a day (BID) | ORAL | Status: DC

## 2014-08-07 NOTE — ED Notes (Signed)
Patient with no complaints at this time. Respirations even and unlabored. Skin warm/dry. Discharge instructions reviewed with patient at this time. Patient given opportunity to voice concerns/ask questions. Patient discharged at this time and left Emergency Department with steady gait.   

## 2014-08-07 NOTE — ED Notes (Signed)
Wound dressed with sterile non-adherent dressing. Wound instructions given.

## 2014-08-07 NOTE — ED Provider Notes (Signed)
CSN: [id number]     Arrival date & time 08/07/14  1110 History  This chart was scribed for non-physician practitioner, Evalee Jefferson, PA-C,working with Orlie Dakin, MD, by Marlowe Kays, ED Scribe. This patient was seen in room APFT23/APFT[facility] and the patient's care was started at 12:00 PM.  Chief Complaint  Patient presents with  . Abscess   Patient is a 43 y.o. female presenting with abscess. The history is provided by the patient. No language interpreter was used.  Abscess Associated symptoms: no fever, no headaches, no nausea and no vomiting     HPI Comments:  Sheila Berg is a 43 y.o. female who presents to the Emergency Department complaining of an abscess to the right buttock that began approximately one month ago. Pt reports she has been doing warm soaks and manipulating the area herself with no drainage of pus, only blood. She states the area was bigger but has gotten smaller. Touching the area increases the pain. Denies alleviating factors. Denies fever, chills, nausea or vomiting. Denies any allergies to any medications.  History reviewed. No pertinent past medical history. History reviewed. No pertinent past surgical history. No family history on file. History  Substance Use Topics  . Smoking status: Current Every Day Smoker -- 0.50 packs/day  . Smokeless tobacco: Not on file  . Alcohol Use: Yes     Comment: occ   OB History    No data available     Review of Systems  Constitutional: Negative for fever and chills.  HENT: Negative for congestion and sore throat.   Eyes: Negative.   Respiratory: Negative for chest tightness and shortness of breath.   Cardiovascular: Negative for chest pain.  Gastrointestinal: Negative for nausea, vomiting and abdominal pain.  Genitourinary: Negative.   Musculoskeletal: Negative for joint swelling, arthralgias and neck pain.  Skin: Positive for color change. Negative for rash and wound.       Abscess to right buttock.  Neurological:  Negative for dizziness, weakness, light-headedness, numbness and headaches.  Psychiatric/Behavioral: Negative.     Allergies  Review of patient's allergies indicates no known allergies.  Home Medications   Prior to Admission medications   Medication Sig Start Date End Date Taking? Authorizing Provider  sulfamethoxazole-trimethoprim (SEPTRA DS) 800-160 MG per tablet Take 1 tablet by mouth every 12 (twelve) hours. 08/07/14   Evalee Jefferson, PA-C   Triage Vitals: BP 137/97 mmHg  Pulse 94  Temp(Src) 99 F (37.2 C) (Oral)  Resp 18  Ht 5\' 8"  (1.727 m)  Wt 118 lb (53.524 kg)  BMI 17.95 kg/m2  SpO2 100%  LMP 07/31/2014 Physical Exam  Constitutional: She appears well-developed and well-nourished.  HENT:  Head: Normocephalic and atraumatic.  Eyes: Conjunctivae are normal.  Neck: Normal range of motion.  Cardiovascular: Normal rate.   Pulmonary/Chest: Effort normal and breath sounds normal.  Musculoskeletal: Normal range of motion.  Neurological: She is alert.  Skin: Skin is warm and dry. No erythema.  1 cm area of induration on right medial buttock near but does not communicate with anus. Not draining. No fluctuance, No surrounding erythema.  Psychiatric: She has a normal mood and affect.  Nursing note and vitals reviewed.   ED Course  Procedures (including critical care time) DIAGNOSTIC STUDIES: Oxygen Saturation is 100% on RA, normal by my interpretation.   COORDINATION OF CARE: 12:05 PM- Will perform needle aspiration. Pt verbalizes understanding and agrees to plan.  INCISION AND DRAINAGE PROCEDURE NOTE: Patient identification was confirmed and verbal consent was  obtained. This procedure was performed by Evalee Jefferson, PA-C at 1:17 PM. Site: right medial buttock Sterile procedures observed Needle size: 18 G used for needle aspiration with trace amount of purulence obtained. Anesthetic used (type and amt): Gebauer's Pain Ease Spray Blade size: 11 used to open the needle site  further after further freeze spray application Drainage: trace of purulence  Complexity: simple Packing used: none Site anesthetized, incision made over site, wound drained and explored loculations, rinsed with copious amounts of normal saline, covered with dry, sterile dressing.  Pt tolerated procedure well without complications.  Instructions for care discussed verbally and pt provided with additional written instructions for homecare and f/u.  Medications - No data to display  Labs Review Labs Reviewed - No data to display  Imaging Review No results found.   EKG Interpretation None      MDM   Final diagnoses:  Abscess of buttock, right    Very small collection of pus within an old appearing indurated  Lesion.  She was encouraged continued warm soaks, placed on septra.  PRN f/u anticipated.    I personally performed the services described in this documentation, which was scribed in my presence. The recorded information has been reviewed and is accurate.    Evalee Jefferson, PA-C 08/08/14 [place], MD 08/08/14 [phone]

## 2014-08-07 NOTE — ED Notes (Signed)
Pt reports abscess to buttocks x 1 month.  Says doesn't hurt unless she touches it.

## 2014-08-07 NOTE — Discharge Instructions (Signed)

## 2014-12-21 ENCOUNTER — Other Ambulatory Visit (HOSPITAL_COMMUNITY): Payer: Self-pay | Admitting: *Deleted

## 2014-12-21 ENCOUNTER — Emergency Department (HOSPITAL_COMMUNITY)
Admission: EM | Admit: 2014-12-21 | Discharge: 2014-12-21 | Disposition: A | Discharge status: Home / Self Care | Payer: Self-pay | Attending: Emergency Medicine | Admitting: Emergency Medicine

## 2014-12-21 ENCOUNTER — Encounter (HOSPITAL_COMMUNITY): Payer: Self-pay | Admitting: *Deleted

## 2014-12-21 DIAGNOSIS — Z720 Tobacco use: Secondary | ICD-10-CM | POA: Insufficient documentation

## 2014-12-21 DIAGNOSIS — N63 Unspecified lump in unspecified breast: Secondary | ICD-10-CM

## 2014-12-21 DIAGNOSIS — N6310 Unspecified lump in the right breast, unspecified quadrant: Secondary | ICD-10-CM

## 2014-12-21 MED ORDER — SULFAMETHOXAZOLE-TRIMETHOPRIM 800-160 MG PO TABS: 1.0000 | ORAL_TABLET | Freq: Two times a day (BID) | ORAL | Status: DC

## 2014-12-21 NOTE — Discharge Instructions (Signed)

## 2014-12-21 NOTE — ED Notes (Signed)
Rt breast "sore and swollen " for 2 day .    With a lump  No d/c

## 2014-12-26 ENCOUNTER — Other Ambulatory Visit (HOSPITAL_COMMUNITY): Payer: Self-pay | Admitting: *Deleted

## 2014-12-26 DIAGNOSIS — N6310 Unspecified lump in the right breast, unspecified quadrant: Secondary | ICD-10-CM

## 2014-12-26 NOTE — ED Provider Notes (Signed)
CSN: [id number]     Arrival date & time 12/21/14  1220 History   First MD Initiated Contact with Patient 12/21/14 1407     Chief Complaint  Patient presents with  . Breast Pain     (Consider location/radiation/quality/duration/timing/severity/associated sxs/prior Treatment) HPI   44 year old female with painful right breast mass. Onset about 2 days ago. Recently more painful and they're in size. Mass is just behind the right nipple. Tender to the touch. No nipple discharge. No skin drainage. No fevers or chills. No history of similar type symptoms or mass. No diagnosed CA hx.  No other acute complaints. Patient is a smoker.  History reviewed. No pertinent past medical history. History reviewed. No pertinent past surgical history. History reviewed. No pertinent family history. History  Substance Use Topics  . Smoking status: Current Every Day Smoker -- 0.50 packs/day  . Smokeless tobacco: Not on file  . Alcohol Use: Yes     Comment: occ   OB History    No data available     Review of Systems  All systems reviewed and negative, other than as noted in HPI.   Allergies  Review of patient's allergies indicates no known allergies.  Home Medications   Prior to Admission medications   Medication Sig Start Date End Date Taking? Authorizing Provider  sulfamethoxazole-trimethoprim (SEPTRA DS) 800-160 MG per tablet Take 1 tablet by mouth every 12 (twelve) hours. 12/21/14   Virgel Manifold, MD   BP 158/112 mmHg  Pulse 85  Temp(Src) 98.1 F (36.7 C) (Oral)  Resp 18  Ht 5\' 8"  (1.727 m)  Wt 118 lb (53.524 kg)  BMI 17.95 kg/m2  SpO2 100%  LMP 12/07/2014 Physical Exam  Constitutional: She appears well-developed and well-nourished. No distress.  HENT:  Head: Normocephalic and atraumatic.  Eyes: Conjunctivae are normal. Right eye exhibits no discharge. Left eye exhibits no discharge.  Neck: Neck supple.  Cardiovascular: Normal rate, regular rhythm and normal heart sounds.  Exam  reveals no gallop and no friction rub.   No murmur heard. Pulmonary/Chest: Effort normal and breath sounds normal. No respiratory distress.  Chaperone present. Approximately 1 1/2-2 cm rounded, tender mass just behind right nipple/areola. No retraction, discharge or overlying skin changes. No axillary nodes palpated.  Abdominal: Soft. She exhibits no distension. There is no tenderness.  Musculoskeletal: She exhibits no edema or tenderness.  Neurological: She is alert.  Skin: Skin is warm and dry.  Psychiatric: She has a normal mood and affect. Her behavior is normal. Thought content normal.  Nursing note and vitals reviewed.   ED Course  Procedures (including critical care time) Labs Review Labs Reviewed - No data to display  Imaging Review No results found.   EKG Interpretation None      MDM   Final diagnoses:  Breast lump    44 year old female with painful right breast mass. We'll place on antibiotics for possible abscess although not clinically convinced. Patient needs imaging such as mammogram or ultrasound. She'll be referred to the breast center. Return precautions were discussed.  Virgel Manifold, MD 12/26/14 [phone]

## 2015-01-04 ENCOUNTER — Other Ambulatory Visit (HOSPITAL_COMMUNITY)
Admission: RE | Admit: 2015-01-04 | Discharge: 2015-01-04 | Disposition: A | Discharge status: Home / Self Care | Payer: Self-pay | Source: Ambulatory Visit | Attending: General Surgery | Admitting: General Surgery

## 2015-01-04 DIAGNOSIS — R229 Localized swelling, mass and lump, unspecified: Secondary | ICD-10-CM | POA: Insufficient documentation

## 2015-01-24 ENCOUNTER — Ambulatory Visit (HOSPITAL_COMMUNITY)
Admission: RE | Admit: 2015-01-24 | Discharge: 2015-01-24 | Disposition: A | Discharge status: Home / Self Care | Payer: Self-pay | Source: Ambulatory Visit | Attending: Obstetrics and Gynecology | Admitting: Obstetrics and Gynecology

## 2015-01-24 ENCOUNTER — Other Ambulatory Visit: Payer: Self-pay | Admitting: Obstetrics and Gynecology

## 2015-01-24 ENCOUNTER — Encounter (INDEPENDENT_AMBULATORY_CARE_PROVIDER_SITE_OTHER): Payer: Self-pay

## 2015-01-24 ENCOUNTER — Encounter (HOSPITAL_COMMUNITY): Payer: Self-pay

## 2015-01-24 VITALS — BP 120/76 | Temp 98.8°F | Ht 66.0 in | Wt 116.0 lb

## 2015-01-24 DIAGNOSIS — N6315 Unspecified lump in the right breast, overlapping quadrants: Secondary | ICD-10-CM

## 2015-01-24 DIAGNOSIS — N6320 Unspecified lump in the left breast, unspecified quadrant: Secondary | ICD-10-CM

## 2015-01-24 DIAGNOSIS — Z01419 Encounter for gynecological examination (general) (routine) without abnormal findings: Secondary | ICD-10-CM | POA: Insufficient documentation

## 2015-01-24 DIAGNOSIS — N6310 Unspecified lump in the right breast, unspecified quadrant: Secondary | ICD-10-CM

## 2015-01-24 DIAGNOSIS — N63 Unspecified lump in breast: Secondary | ICD-10-CM | POA: Insufficient documentation

## 2015-01-24 DIAGNOSIS — N6323 Unspecified lump in the left breast, lower outer quadrant: Secondary | ICD-10-CM

## 2015-01-24 DIAGNOSIS — N6311 Unspecified lump in the right breast, upper outer quadrant: Secondary | ICD-10-CM

## 2015-01-24 NOTE — Patient Instructions (Addendum)
Educational materials on self breast awareness given. Explained to Sheila Berg that [facility] will cover Pap smears and co-testing every 5 years unless has a history of abnormal Pap smears. [facility] Appointment scheduled following [facility] appointment. Patient aware of appointment and will be there. Let patient know will follow up with her within the next couple weeks with results of Pap smear by phone. Smoking cessation discussed with patient and resources given. Referred patient to the [facility] Quitline.Sheila Berg verbalized understanding. Patient escorted to [facility].

## 2015-01-24 NOTE — Progress Notes (Addendum)
Complaints of right breast lump x 1 month that is sore to the touch. Patient stated pain has improved since taking 2 rounds of antibiotics stating it only hurts now when touched. Patient rates pain at a 10 out of 10 when touched.  Pap Smear: Pap smear completed today. Patients last Pap smear was over 5 years ago by Dr. Glo Herring at [facility] and normal per patient. Per patient has a history of an abnormal Pap smear when she was 44 years old that required a colposcopy for follow-up. Patient stated all Pap smears have been normal since last abnormal. No Pap smear results in EPIC.  Physical exam: Breasts Breasts symmetrical. No skin abnormalities bilateral breasts. No nipple retraction bilateral breasts. No nipple discharge bilateral breasts. No lymphadenopathy. Palpated one lump left breast at 5 o'clock next to areola. Palpated two lumps within the right breast at 10 o'clock 3 cm from the nipple and at 9 o'clock under the areola. Patient complained of tenderness when palpated lump within the right breast at 9 o'clock. Referred patient to [facility] for a diagnostic mammogram. Appointment scheduled following [facility] appointment.        Pelvic/Bimanual   Ext Genitalia No lesions, no swelling and no discharge observed on external genitalia.         Vagina Vagina pink and normal texture. No lesions or discharge observed in vagina.          Cervix Cervix is present. Cervix pink and of normal texture. No discharge observed.     Uterus Uterus is present and palpable. Uterus in normal position and normal size.        Adnexae Bilateral ovaries present and palpable. No tenderness on palpation.          Rectovaginal No rectal exam completed today since patient had no rectal complaints. No skin abnormalities observed on exam.      Smoking cessation discussed with patient and resources given. Referred patient to the [facility] Quit[facility].

## 2015-01-24 NOTE — Addendum Note (Signed)
Encounter addended by: Loletta Parish, RN on: 01/24/2015  4:10 PM<BR>     Documentation filed: ED Follow-Up, Patient Instructions Section, Notes Section

## 2015-01-25 ENCOUNTER — Encounter (HOSPITAL_COMMUNITY): Payer: Self-pay | Admitting: Hematology & Oncology

## 2015-01-25 ENCOUNTER — Encounter (HOSPITAL_COMMUNITY): Payer: Self-pay | Attending: Hematology & Oncology | Admitting: Hematology & Oncology

## 2015-01-25 ENCOUNTER — Telehealth (HOSPITAL_COMMUNITY): Payer: Self-pay | Admitting: Hematology & Oncology

## 2015-01-25 ENCOUNTER — Encounter (HOSPITAL_COMMUNITY): Payer: Self-pay

## 2015-01-25 VITALS — BP 128/77 | HR 80 | Temp 98.3°F | Resp 18 | Ht 67.0 in | Wt 117.6 lb

## 2015-01-25 DIAGNOSIS — D6489 Other specified anemias: Secondary | ICD-10-CM

## 2015-01-25 DIAGNOSIS — D509 Iron deficiency anemia, unspecified: Secondary | ICD-10-CM

## 2015-01-25 MED ORDER — IRON POLYSACCH CMPLX-B12-FA 150-0.025-1 MG PO CAPS: 1.0000 | ORAL_CAPSULE | Freq: Every day | ORAL | Status: DC

## 2015-01-25 NOTE — Telephone Encounter (Signed)
Met with pt and advised her of my job duties. She stated she has applied for medicaid. Advised her °That I could assist in finding free meds if available. ° °Angela Denny °Financial Advocate °Medical Oncology °[phone] °

## 2015-01-25 NOTE — Patient Instructions (Signed)
..  [place] at [facility] Discharge Instructions  RECOMMENDATIONS MADE BY THE CONSULTANT AND ANY TEST RESULTS WILL BE SENT TO YOUR REFERRING PHYSICIAN.  We will hold off on next appointment until we see what happens with medicaid pending  Thank you for choosing [place] at [facility] to provide your oncology and hematology care.  To afford each patient quality time with our provider, please arrive at least 15 minutes before your scheduled appointment time.    You need to re-schedule your appointment should you arrive 10 or more minutes late.  We strive to give you quality time with our providers, and arriving late affects you and other patients whose appointments are after yours.  Also, if you no show three or more times for appointments you may be dismissed from the clinic at the providers discretion.     Again, thank you for choosing [facility].  Our hope is that these requests will decrease the amount of time that you wait before being seen by our physicians.       _____________________________________________________________  Should you have questions after your visit to [facility], please contact our office at [phone] between the hours of 8:30 a.m. and 4:30 p.m.  Voicemails left after 4:30 p.m. will not be returned until the following business day.  For prescription refill requests, have your pharmacy contact our office.

## 2015-01-25 NOTE — Progress Notes (Signed)
[facility] NOTE  CHIEF COMPLAINTS/PURPOSE OF CONSULTATION:  Iron deficiency anemia Ferritin 11 ng/mL, 01/05/2015  %SAT 4%, 01/05/2015 Hemoglobin 7.2 L, 01/05/2015 Hematocrit 25.2 L, 01/05/2015 MCV 67.6 L, 01/05/2015 Abnormal mammogram on 01/24/2015 with follow in 6 months  HISTORY OF PRESENTING ILLNESS:  Sheila Berg 44 y.o. female is here because of microcytic anemia. Hemoglobin was 7.2, HCT 25.2 and MCV of 67.6 on 01/05/2015.  She still has periods, she says they are heavy. Occuring every 20-21 days, the heaveist is the first 2-3 days and the last 2 is lighter only requiring a panty liner. In the first couple days she swaps between pads and tampons. She does have to change her sheets and clothes during the heavy days. She eats ice, her mother says "all the time".  She gets crushed ice with a bit of water mixed in. Heavily started chewing ice about 3 months ago.   She has blood in her stool and it has happened for years. The blood is in the toilet. This has been happening for about 3 years, maybe longer. Came to the hospital once and mentioned it, they told her it was nothing. Her stool isn't hard all the time and even when it isn't hard she still sees blood in the toilet.   She doesn't take iron pills because they make her constipated and that makes things worse. The constipation makes her not want to eat.  Up to date with her mammograms, last one being performed yesterday. This was her first mammogram due to her financial situation. 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   MEDICAL HISTORY:  Past Medical History  Diagnosis Date  . Anxiety     SURGICAL HISTORY: History reviewed. No pertinent past surgical history.  SOCIAL HISTORY: History   Social History  . Marital Status: Single    Spouse Name: N/A  . Number of Children: N/A  . Years of Education: N/A   Occupational History  . Not on file.   Social History  Main Topics  . Smoking status: Current Every Day Smoker -- 0.50 packs/day for 25 years  . Smokeless tobacco: Not on file  . Alcohol Use: Yes     Comment: occ  . Drug Use: No  . Sexual Activity: Yes    Birth Control/ Protection: None   Other Topics Concern  . Not on file   Social History Narrative  One child. 27 yo. 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 Smoker. 1/2 ppd. Started at 44 yo. ETOH occasionally. Dating.    FAMILY HISTORY: Family History  Problem Relation Age of Onset  . Diabetes Mother   . Hypertension Mother   . Hypertension Father   . Breast cancer Maternal Aunt   . Diabetes Paternal Aunt   . Diabetes Maternal Grandmother   . Diabetes Paternal Aunt   . Stroke Maternal Aunt    indicated that her mother is alive. She indicated that her father is alive.   Mother, 54 yo, high blood pressure. Father, 87 yo, high blood pressure. 2 brothers, 1 sister. They are healthy.  ALLERGIES:  has No Known Allergies.  MEDICATIONS:  Current Outpatient Prescriptions  Medication Sig Dispense Refill  . Prenatal Vit-Fe Psac Cmplx-FA (POLY IRON PN PO) Take by mouth.     No current facility-administered medications for this visit.    Review of Systems  Constitutional: Positive for malaise/fatigue. Negative for fever, chills and weight loss.  Fatigue due to anemia.  HENT: Negative for congestion, hearing loss, nosebleeds, sore throat and tinnitus.   Eyes: Positive for blurred vision. Negative for double vision, pain and discharge.       She sees "specks in her vision sometimes".  Respiratory: Negative for cough, hemoptysis, sputum production, shortness of breath and wheezing.   Cardiovascular: Negative for chest pain, palpitations, claudication, leg swelling and PND.  Gastrointestinal: Positive for constipation and blood in stool. Negative for heartburn, nausea, vomiting, abdominal pain, diarrhea and melena.       Blood in the  toilet for the last 3 years with solid and soft stool.  Genitourinary: Negative for dysuria, urgency, frequency and hematuria.  Musculoskeletal: Negative for myalgias, joint pain and falls.  Skin: Negative for itching and rash.  Neurological: Positive for headaches. Negative for dizziness, tingling, tremors, sensory change, speech change, focal weakness, seizures, loss of consciousness and weakness.  Endo/Heme/Allergies: Does not bruise/bleed easily.  Psychiatric/Behavioral: Negative for depression, suicidal ideas, memory loss and substance abuse. The patient is not nervous/anxious and does not have insomnia.    14 point ROS was done and is otherwise as detailed above or in HPI   PHYSICAL EXAMINATION:  ECOG PERFORMANCE STATUS: 0 - Asymptomatic  Filed Vitals:   01/25/15 1436  BP: 128/77  Pulse: 80  Temp: 98.3 F (36.8 C)  Resp: 18   Filed Weights   01/25/15 1436  Weight: 117 lb 9.6 oz (53.343 kg)     Physical Exam  Constitutional: She is oriented to person, place, and time and well-developed, well-nourished, and in no distress.  HENT:  Head: Normocephalic and atraumatic.  Nose: Nose normal.  Mouth/Throat: Oropharynx is clear and moist. No oropharyngeal exudate.  Eyes: Conjunctivae and EOM are normal. Pupils are equal, round, and reactive to light. Right eye exhibits no discharge. Left eye exhibits no discharge. No scleral icterus.  Neck: Normal range of motion. Neck supple. No tracheal deviation present. No thyromegaly present.  Cardiovascular: Normal rate, regular rhythm and normal heart sounds.  Exam reveals no gallop and no friction rub.   No murmur heard. Pulmonary/Chest: Effort normal and breath sounds normal. She has no wheezes. She has no rales. Right breast exhibits tenderness.    Tenderness due to recent mammogram.  Abdominal: Soft. Bowel sounds are normal. She exhibits no distension and no mass. There is no tenderness. There is no rebound and no guarding.    Musculoskeletal: Normal range of motion. She exhibits no edema.  Lymphadenopathy:    She has no cervical adenopathy.  Neurological: She is alert and oriented to person, place, and time. She has normal reflexes. No cranial nerve deficit. Gait normal. Coordination normal.  Skin: Skin is warm and dry. No rash noted.  Psychiatric: Mood, memory, affect and judgment normal.  Nursing note and vitals reviewed.    LABORATORY DATA:  I have reviewed the data as listed Lab Results  Component Value Date   WBC 6.9 09/13/2011   HGB 10.3* 09/13/2011   HCT 31.7* 09/13/2011   MCV 76.0* 09/13/2011   PLT 315 09/13/2011   Outside laboratory studies available for review show a TSH that is normal at 1.71, CBC with differential on 01/05/2015 shows a hemoglobin of 7.2, hematocrit of 25.2, MCV of 67.6. She has evidence of target cells, elliptocytes, and polychromasia. Iron studies showed a serum iron at 15, elevated TIBC with a percent saturation low at 4%. Ferritin is 11 ng/ml.   RADIOGRAPHIC STUDIES: I have personally reviewed the radiological images  as listed and agreed with the findings in the report. Mm Digital Diagnostic Bilat  01/24/2015   CLINICAL DATA:  Palpable abnormalities bilaterally. The patient reports recently completing 2 courses of antibiotics for a tender palpable mass in the lower portion of the right breast. She thinks this mass has resolved. However, 2 palpable nodules were noted in the right breast and 1 in the left breast on recent physical exam.  EXAM: DIGITAL DIAGNOSTIC BILATERAL MAMMOGRAM WITH CAD  ULTRASOUND BILATERAL BREAST  COMPARISON:  Baseline exam  ACR Breast Density Category d: The breast tissue is extremely dense, which lowers the sensitivity of mammography.  FINDINGS: No suspicious mass, distortion, or microcalcifications are identified to suggest presence of malignancy. Spot tangential views of the areas of concern bilaterally are negative.  Mammographic images were processed  with CAD.  On physical exam, I palpate no abnormality in the lower outer periareolar region of the right breast. I palpate a discrete mobile mass in the retroareolar region of the 3 o'clock location of the right breast.  I palpate no abnormality in the lateral aspect of the left breast.  Targeted ultrasound is performed, showing normal appearing dense fibroglandular tissue in the lower outer quadrant of the right breast periareolar region. In the 3 o'clock location of the right breast 1 cm from the nipple there is a small circumscribed hypoechoic nodule corresponding to the palpable abnormality. This nodule measures 0.6 x 0.4 x 0.6 cm. There is associated increased through transmission.  In the lateral aspect of the left breast, dense fibroglandular tissue is imaged. No suspicious mass, distortion, or acoustic shadowing is demonstrated with ultrasound.  IMPRESSION: 1. 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 2. No mammographic or sonographic findings in the other areas of concern.  RECOMMENDATION: Right breast ultrasound suggested in 6 months to assess stability of probable fibroadenoma in the 3 o'clock location of the right breast.  I have discussed the findings and recommendations with the patient. Results were also provided in writing at the conclusion of the visit. If applicable, a reminder letter will be sent to the patient regarding the next appointment.  BI-RADS CATEGORY  3: Probably benign.   Electronically Signed   By: Nolon Nations M.D.   On: 01/24/2015 16:06   US Breast Complete Uni Left Inc Axilla  01/24/2015   CLINICAL DATA:  Palpable abnormalities bilaterally. The patient reports recently completing 2 courses of antibiotics for a tender palpable mass in the lower portion of the right breast. She thinks this mass has resolved. However, 2 palpable nodules were noted in the right breast and 1 in the left breast on  recent physical exam.  EXAM: DIGITAL DIAGNOSTIC BILATERAL MAMMOGRAM WITH CAD  ULTRASOUND BILATERAL BREAST  COMPARISON:  Baseline exam  ACR Breast Density Category d: The breast tissue is extremely dense, which lowers the sensitivity of mammography.  FINDINGS: No suspicious mass, distortion, or microcalcifications are identified to suggest presence of malignancy. Spot tangential views of the areas of concern bilaterally are negative.  Mammographic images were processed with CAD.  On physical exam, I palpate no abnormality in the lower outer periareolar region of the right breast. I palpate a discrete mobile mass in the retroareolar region of the 3 o'clock location of the right breast.  I palpate no abnormality in the lateral aspect of the left breast.  Targeted ultrasound is performed, showing normal appearing dense fibroglandular tissue in the lower outer quadrant of  the right breast periareolar region. In the 3 o'clock location of the right breast 1 cm from the nipple there is a small circumscribed hypoechoic nodule corresponding to the palpable abnormality. This nodule measures 0.6 x 0.4 x 0.6 cm. There is associated increased through transmission.  In the lateral aspect of the left breast, dense fibroglandular tissue is imaged. No suspicious mass, distortion, or acoustic shadowing is demonstrated with ultrasound.  IMPRESSION: 1. 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 2. No mammographic or sonographic findings in the other areas of concern.  RECOMMENDATION: Right breast ultrasound suggested in 6 months to assess stability of probable fibroadenoma in the 3 o'clock location of the right breast.  I have discussed the findings and recommendations with the patient. Results were also provided in writing at the conclusion of the visit. If applicable, a reminder letter will be sent to the patient regarding the next appointment.   BI-RADS CATEGORY  3: Probably benign.   Electronically Signed   By: Nolon Nations M.D.   On: 01/24/2015 16:06   US Breast Ltd Uni Right Inc Axilla  01/24/2015   CLINICAL DATA:  Palpable abnormalities bilaterally. The patient reports recently completing 2 courses of antibiotics for a tender palpable mass in the lower portion of the right breast. She thinks this mass has resolved. However, 2 palpable nodules were noted in the right breast and 1 in the left breast on recent physical exam.  EXAM: DIGITAL DIAGNOSTIC BILATERAL MAMMOGRAM WITH CAD  ULTRASOUND BILATERAL BREAST  COMPARISON:  Baseline exam  ACR Breast Density Category d: The breast tissue is extremely dense, which lowers the sensitivity of mammography.  FINDINGS: No suspicious mass, distortion, or microcalcifications are identified to suggest presence of malignancy. Spot tangential views of the areas of concern bilaterally are negative.  Mammographic images were processed with CAD.  On physical exam, I palpate no abnormality in the lower outer periareolar region of the right breast. I palpate a discrete mobile mass in the retroareolar region of the 3 o'clock location of the right breast.  I palpate no abnormality in the lateral aspect of the left breast.  Targeted ultrasound is performed, showing normal appearing dense fibroglandular tissue in the lower outer quadrant of the right breast periareolar region. In the 3 o'clock location of the right breast 1 cm from the nipple there is a small circumscribed hypoechoic nodule corresponding to the palpable abnormality. This nodule measures 0.6 x 0.4 x 0.6 cm. There is associated increased through transmission.  In the lateral aspect of the left breast, dense fibroglandular tissue is imaged. No suspicious mass, distortion, or acoustic shadowing is demonstrated with ultrasound.  IMPRESSION: 1. 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 2. No mammographic or sonographic findings in the other areas of concern.  RECOMMENDATION: Right breast ultrasound suggested in 6 months to assess stability of probable fibroadenoma in the 3 o'clock location of the right breast.  I have discussed the findings and recommendations with the patient. Results were also provided in writing at the conclusion of the visit. If applicable, a reminder letter will be sent to the patient regarding the next appointment.  BI-RADS CATEGORY  3: Probably benign.   Electronically Signed   By: Nolon Nations M.D.   On: 01/24/2015 16:06    ASSESSMENT & PLAN:  Microcytic Anemia Iron deficiency Intolerance to oral iron Ongoing Menses, patient  report of BRBPR Lack of insurance  She has evidence of iron deficiency. She has ongoing menstrual cycles. She has reported bright red blood per rectum for some time and not had a GI evaluation. She has evidence of target cells reported on a CBC with differential, which can be seen in thalassemia and severe iron deficiency.  She is uninsured currently but has applied for health benefits. She would like to try a different oral iron formulation prior to any IV replacement. We could consider liquid iron formulations or Ferrex forte which can be fairly well-tolerated orally. She has opted for Ferrex forte. She will be instructed to take it daily.  At her follow-up appointment we will talk about performing Hemoccult cards and if positive refer her to GI. She will need a laboratory reevaluation including a peripheral smear review.  She was advised to continue with follow-up of her recent mammogram.  Orders Placed This Encounter  Procedures  . CBC with Differential    Standing Status: Future     Number of Occurrences:      Standing Expiration Date: 01/25/2016  . Ferritin    Standing Status: Future     Number of Occurrences:      Standing Expiration Date: 01/25/2016  . Iron and TIBC    Standing Status: Future      Number of Occurrences:      Standing Expiration Date: 01/25/2016  . Vitamin B12    Standing Status: Future     Number of Occurrences:      Standing Expiration Date: 01/25/2016  . Folate    Standing Status: Future     Number of Occurrences:      Standing Expiration Date: 01/25/2016    All questions were answered. The patient knows to call the clinic with any problems, questions or concerns. This note was electronically signed.    This document serves as a record of services personally performed by Ancil Linsey, MD. It was created on her behalf by Arlyce Harman, a trained medical scribe. The creation of this record is based on the scribe's personal observations and the provider's statements to them. This document has been checked and approved by the attending provider.  I have reviewed the above documentation for accuracy and completeness, and I agree with the above.  Molli Hazard, MD

## 2015-01-26 LAB — CYTOLOGY - PAP

## 2015-01-31 ENCOUNTER — Telehealth (HOSPITAL_COMMUNITY): Payer: Self-pay | Admitting: *Deleted

## 2015-01-31 NOTE — Telephone Encounter (Signed)
Telephoned patient at home # and discussed normal pap smear results. HPV was also negative. Next pap smear due in 5 years. Patient voiced understanding.

## 2015-02-06 ENCOUNTER — Encounter (HOSPITAL_COMMUNITY): Payer: Self-pay | Admitting: Hematology & Oncology

## 2015-02-28 ENCOUNTER — Ambulatory Visit (HOSPITAL_COMMUNITY): Payer: Self-pay | Admitting: Hematology & Oncology

## 2015-02-28 ENCOUNTER — Other Ambulatory Visit (HOSPITAL_COMMUNITY): Payer: Self-pay

## 2015-03-01 NOTE — Progress Notes (Signed)
This encounter was created in error - please disregard.

## 2015-03-10 ENCOUNTER — Encounter (HOSPITAL_COMMUNITY): Payer: Self-pay

## 2015-04-10 ENCOUNTER — Observation Stay (HOSPITAL_COMMUNITY)
Admission: EM | Admit: 2015-04-10 | Discharge: 2015-04-11 | Disposition: A | Discharge status: Home / Self Care | Payer: Medicaid Other | Attending: Internal Medicine | Admitting: Internal Medicine

## 2015-04-10 ENCOUNTER — Encounter (HOSPITAL_COMMUNITY): Payer: Self-pay

## 2015-04-10 DIAGNOSIS — D649 Anemia, unspecified: Principal | ICD-10-CM | POA: Insufficient documentation

## 2015-04-10 DIAGNOSIS — R51 Headache: Secondary | ICD-10-CM

## 2015-04-10 DIAGNOSIS — F419 Anxiety disorder, unspecified: Secondary | ICD-10-CM | POA: Insufficient documentation

## 2015-04-10 DIAGNOSIS — G44229 Chronic tension-type headache, not intractable: Secondary | ICD-10-CM

## 2015-04-10 DIAGNOSIS — G43909 Migraine, unspecified, not intractable, without status migrainosus: Secondary | ICD-10-CM | POA: Diagnosis not present

## 2015-04-10 DIAGNOSIS — K922 Gastrointestinal hemorrhage, unspecified: Secondary | ICD-10-CM

## 2015-04-10 DIAGNOSIS — K649 Unspecified hemorrhoids: Secondary | ICD-10-CM | POA: Diagnosis not present

## 2015-04-10 DIAGNOSIS — R5383 Other fatigue: Secondary | ICD-10-CM | POA: Diagnosis not present

## 2015-04-10 DIAGNOSIS — Z79899 Other long term (current) drug therapy: Secondary | ICD-10-CM | POA: Insufficient documentation

## 2015-04-10 DIAGNOSIS — Z720 Tobacco use: Secondary | ICD-10-CM | POA: Diagnosis not present

## 2015-04-10 DIAGNOSIS — D509 Iron deficiency anemia, unspecified: Secondary | ICD-10-CM

## 2015-04-10 DIAGNOSIS — R7989 Other specified abnormal findings of blood chemistry: Secondary | ICD-10-CM | POA: Diagnosis present

## 2015-04-10 DIAGNOSIS — T39395A Adverse effect of other nonsteroidal anti-inflammatory drugs [NSAID], initial encounter: Secondary | ICD-10-CM

## 2015-04-10 DIAGNOSIS — R519 Headache, unspecified: Secondary | ICD-10-CM

## 2015-04-10 HISTORY — DX: Unspecified hemorrhoids: K64.9

## 2015-04-10 HISTORY — DX: Migraine, unspecified, not intractable, without status migrainosus: G43.909

## 2015-04-10 LAB — BASIC METABOLIC PANEL
ANION GAP: 7 (ref 5–15)
BUN: 10 mg/dL (ref 6–20)
CO2: 23 mmol/L (ref 22–32)
Calcium: 8.6 mg/dL — ABNORMAL LOW (ref 8.9–10.3)
Chloride: 108 mmol/L (ref 101–111)
Creatinine, Ser: 0.71 mg/dL (ref 0.44–1.00)
GFR calc Af Amer: >60 mL/min (ref >60)
GFR calc non Af Amer: >60 mL/min (ref >60)
Glucose, Bld: 87 mg/dL (ref 65–99)
POTASSIUM: 3.8 mmol/L (ref 3.5–5.1)
Sodium: 138 mmol/L (ref 135–145)

## 2015-04-10 LAB — IRON AND TIBC
IRON: 7 ug/dL — AB (ref 28–170)
SATURATION RATIOS: 1 % — AB (ref 10.4–31.8)
TIBC: 504 ug/dL — ABNORMAL HIGH (ref 250–450)
UIBC: 497 ug/dL

## 2015-04-10 LAB — CBC WITH DIFFERENTIAL/PLATELET
BASOS ABS: 0.1 10*3/uL (ref 0.0–0.1)
BASOS PCT: 2 % — AB (ref 0–1)
Eosinophils Absolute: 0.1 10*3/uL (ref 0.0–0.7)
Eosinophils Relative: 2 % (ref 0–5)
HCT: 20.4 % — ABNORMAL LOW (ref 36.0–46.0)
Hemoglobin: 5.9 g/dL — CL (ref 12.0–15.0)
LYMPHS PCT: 39 % (ref 12–46)
Lymphs Abs: 2.2 10*3/uL (ref 0.7–4.0)
MCH: 20.3 pg — ABNORMAL LOW (ref 26.0–34.0)
MCHC: 28.9 g/dL — ABNORMAL LOW (ref 30.0–36.0)
MCV: 70.3 fL — ABNORMAL LOW (ref 78.0–100.0)
MONO ABS: 0.4 10*3/uL (ref 0.1–1.0)
MONOS PCT: 8 % (ref 3–12)
NEUTROS ABS: 2.8 10*3/uL (ref 1.7–7.7)
Neutrophils Relative %: 50 % (ref 43–77)
Platelets: 332 10*3/uL (ref 150–400)
RBC: 2.9 MIL/uL — ABNORMAL LOW (ref 3.87–5.11)
RDW: 22.1 % — ABNORMAL HIGH (ref 11.5–15.5)
WBC: 5.6 10*3/uL (ref 4.0–10.5)

## 2015-04-10 LAB — URINALYSIS, ROUTINE W REFLEX MICROSCOPIC
BILIRUBIN URINE: NEGATIVE
GLUCOSE, UA: NEGATIVE mg/dL
Hgb urine dipstick: NEGATIVE
Ketones, ur: NEGATIVE mg/dL
LEUKOCYTES UA: NEGATIVE
Nitrite: NEGATIVE
PH: 7.5 (ref 5.0–8.0)
Protein, ur: NEGATIVE mg/dL
SPECIFIC GRAVITY, URINE: 1.01 (ref 1.005–1.030)
Urobilinogen, UA: 0.2 mg/dL (ref 0.0–1.0)

## 2015-04-10 LAB — FERRITIN: FERRITIN: 1 ng/mL — AB (ref 11–307)

## 2015-04-10 LAB — VITAMIN B12: VITAMIN B 12: 357 pg/mL (ref 180–914)

## 2015-04-10 LAB — RETICULOCYTES
RBC.: 2.9 MIL/uL — ABNORMAL LOW (ref 3.87–5.11)
RETIC COUNT ABSOLUTE: 81.2 10*3/uL (ref 19.0–186.0)
Retic Ct Pct: 2.8 % (ref 0.4–3.1)

## 2015-04-10 LAB — PREGNANCY, URINE: PREG TEST UR: NEGATIVE

## 2015-04-10 LAB — ABO/RH: ABO/RH(D): A POS

## 2015-04-10 LAB — PREPARE RBC (CROSSMATCH)

## 2015-04-10 MED ORDER — SODIUM CHLORIDE 0.9 % IJ SOLN: 3.0000 mL | Freq: Two times a day (BID) | INTRAMUSCULAR | Status: DC

## 2015-04-10 MED ORDER — CYCLOBENZAPRINE HCL 10 MG PO TABS: 5.0000 mg | ORAL_TABLET | Freq: Three times a day (TID) | ORAL | Status: DC | PRN

## 2015-04-10 MED ORDER — NICOTINE 14 MG/24HR TD PT24: 14.0000 mg | MEDICATED_PATCH | Freq: Every day | TRANSDERMAL | Status: DC | PRN

## 2015-04-10 MED ORDER — SODIUM CHLORIDE 0.9 % IV SOLN
8.0000 mg/h | INTRAVENOUS | Status: DC
  Administered 2015-04-10: 8 mg/h via INTRAVENOUS
  Filled 2015-04-10 (×2): qty 80

## 2015-04-10 MED ORDER — SODIUM CHLORIDE 0.9 % IV SOLN
10.0000 mL/h | Freq: Once | INTRAVENOUS | Status: AC
  Administered 2015-04-10: 10 mL/h via INTRAVENOUS

## 2015-04-10 MED ORDER — SODIUM CHLORIDE 0.9 % IV SOLN
80.0000 mg | Freq: Once | INTRAVENOUS | Status: AC
  Administered 2015-04-10: 80 mg via INTRAVENOUS
  Filled 2015-04-10: qty 80

## 2015-04-10 MED ORDER — SODIUM CHLORIDE 0.9 % IV SOLN: 250.0000 mL | INTRAVENOUS | Status: DC | PRN

## 2015-04-10 MED ORDER — ONDANSETRON HCL 4 MG/2ML IJ SOLN: 4.0000 mg | Freq: Four times a day (QID) | INTRAMUSCULAR | Status: DC | PRN

## 2015-04-10 MED ORDER — PANTOPRAZOLE SODIUM 40 MG IV SOLR: 40.0000 mg | Freq: Two times a day (BID) | INTRAVENOUS | Status: DC

## 2015-04-10 MED ORDER — ACETAMINOPHEN 500 MG PO TABS: 1000.0000 mg | ORAL_TABLET | Freq: Three times a day (TID) | ORAL | Status: DC | PRN

## 2015-04-10 MED ORDER — ACETAMINOPHEN 650 MG RE SUPP: 975.0000 mg | Freq: Four times a day (QID) | RECTAL | Status: DC | PRN

## 2015-04-10 MED ORDER — PANTOPRAZOLE SODIUM 40 MG IV SOLR
INTRAVENOUS | Status: AC
  Filled 2015-04-10: qty 160

## 2015-04-10 MED ORDER — SODIUM CHLORIDE 0.9 % IJ SOLN: 3.0000 mL | INTRAMUSCULAR | Status: DC | PRN

## 2015-04-10 MED ORDER — ONDANSETRON HCL 4 MG PO TABS: 4.0000 mg | ORAL_TABLET | Freq: Four times a day (QID) | ORAL | Status: DC | PRN

## 2015-04-10 NOTE — ED Notes (Signed)
Sent by PCP for abnormal lab values. Hemoglobin 5.2 drawn this morning.

## 2015-04-10 NOTE — ED Notes (Signed)
CRITICAL VALUE ALERT  Critical value received:  HGB  Date of notification:  04/10/15  Time of notification:  1914  Critical value read back:Yes.    Nurse who received alert:  Merit Gadsby,RN  MD notified (1st page):  Dr Thurnell Garbe  Time of first page:  [phone]

## 2015-04-10 NOTE — H&P (Signed)
[facility] History and Physical  Sheila Berg MEQ:[id number] DOB: 01/16/1971 DOA: 04/10/2015  Referring physician: Dr. Thurnell Garbe - [facility] PCP: No PCP Per Patient   Chief Complaint: Low Hgb  HPI: Sheila Berg is a 44 y.o. female  Pt presenting to [facility] for evaluation of low blood. Had blood work done today at primary care physician's office and was told her hemoglobin was approximately 5.2 and needed further evaluation emergency room. Of note patient established care in that clinic 2 months ago and had blood work drawn at that time which showed a hemoglobin of 7.2. Patient has fairly regular headaches which start in the back of her head. These come and go. Sometimes improves with massage of the muscles of the back of the skull. Also improves with 2 extra strength Excedrin or Advil 800 mg. Of note patient takes NSAIDs 2-5 days per week. After patient's initial visit 2 months ago with a low hemoglobin of 7.2 she was started on iron daily. She states that prior to starting iron her stools were daily and soft but occasionally red to very dark in color and soft. Since starting the iron her stools become much more black. Her last iron pills one day ago. Patient endorses occasional fatigue but nothing out of the ordinary for her. She otherwise feels at baseline. Of note patients menstrual cycle is every 21-28 days. Her periods typically last 7 days with 3-4 days of heavy flow being defined as 3-5 pads on those days. Denies any unintentional weight loss, fevers, night sweats  Review of Systems:  Constitutional:  No weight loss, night sweats, Fevers, chills,  HEENT:  No headaches, Difficulty swallowing,Tooth/dental problems,Sore throat,  No sneezing, itching, ear ache, nasal congestion, post nasal drip,  Cardio-vascular:  No chest pain, Orthopnea, PND, swelling in lower extremities, anasarca, dizziness, palpitations  GI: Per HPI Resp:   No shortness of breath with exertion or at rest. No excess  mucus, no productive cough, No non-productive cough, No coughing up of blood.No change in color of mucus.No wheezing.No chest wall deformity  Skin:  no rash or lesions.  GU:  no dysuria, change in color of urine, no urgency or frequency. No flank pain.  Musculoskeletal:   No joint pain or swelling. No decreased range of motion. No back pain.  Psych:  No change in mood or affect. No depression or anxiety. No memory loss.   Past Medical History  Diagnosis Date  . Anxiety   . Migraine   . Hemorrhoids    History reviewed. No pertinent past surgical history. Social History:  reports that she has been smoking.  She does not have any smokeless tobacco history on file. She reports that she drinks alcohol. She reports that she does not use illicit drugs.  No Known Allergies  Family History  Problem Relation Age of Onset  . Diabetes Mother   . Hypertension Mother   . Hypertension Father   . Breast cancer Maternal Aunt   . Diabetes Paternal Aunt   . Diabetes Maternal Grandmother   . Diabetes Paternal Aunt   . Stroke Maternal Aunt      Prior to Admission medications   Medication Sig Start Date End Date Taking? Authorizing Provider  aspirin-acetaminophen-caffeine (EXCEDRIN EXTRA STRENGTH) [phone] MG per tablet Take 1-2 tablets by mouth every 6 (six) hours as needed for headache.   Yes Historical Provider, MD  iron polysaccharides (POLY-IRON 150) 150 MG capsule Take 150 mg by mouth daily.   Yes Historical Provider, MD  Iron Polysacch Cmplx-B12-FA 150-0.025-1 MG CAPS Take 1 capsule by mouth daily. Patient not taking: Reported on 04/10/2015 01/25/15   Patrici Ranks, MD   Physical Exam: Filed Vitals:   04/10/15 1607 04/10/15 1839  BP: 139/96 125/93  Pulse: 90 75  Temp: 99.4 F (37.4 C) 98.5 F (36.9 C)  TempSrc: Oral   Resp: 16 16  Height: 5\' 8"  (1.727 m)   Weight: 55.339 kg (122 lb)   SpO2: 100% 100%    Wt Readings from Last 3 Encounters:  04/10/15 55.339 kg (122 lb)    01/25/15 53.343 kg (117 lb 9.6 oz)  01/24/15 52.617 kg (116 lb)    General:  Appears calm and comfortable Eyes:  PERRL, normal lids, irises & conjunctiva ENT:  grossly normal hearing, lips & tongue Neck:  no LAD, masses or thyromegaly Cardiovascular:  RRR, no m/r/g. No LE edema. Telemetry:  SR, no arrhythmias  Respiratory:  CTA bilaterally, no w/r/r. Normal respiratory effort. Abdomen:  soft, ntnd Skin:  no rash or induration seen on limited exam Musculoskeletal:  grossly normal tone BUE/BLE Psychiatric:  grossly normal mood and affect, speech fluent and appropriate Neurologic:  grossly non-focal.          Labs on Admission:  Basic Metabolic Panel:  Recent Labs Lab 04/10/15 1634  NA 138  K 3.8  CL 108  CO2 23  GLUCOSE 87  BUN 10  CREATININE 0.71  CALCIUM 8.6*   Liver Function Tests: No results for input(s): AST, ALT, ALKPHOS, BILITOT, PROT, ALBUMIN in the last 168 hours. No results for input(s): LIPASE, AMYLASE in the last 168 hours. No results for input(s): AMMONIA in the last 168 hours. CBC:  Recent Labs Lab 04/10/15 1634  WBC 5.6  NEUTROABS 2.8  HGB 5.9*  HCT 20.4*  MCV 70.3*  PLT 332   Cardiac Enzymes: No results for input(s): CKTOTAL, CKMB, CKMBINDEX, TROPONINI in the last 168 hours.  BNP (last 3 results) No results for input(s): BNP in the last 8760 hours.  ProBNP (last 3 results) No results for input(s): PROBNP in the last 8760 hours.  CBG: No results for input(s): GLUCAP in the last 168 hours.  Radiological Exams on Admission: No results found.   Assessment/Plan Principal Problem:   Microcytic anemia Active Problems:   Chronic headaches   Tobacco use   GI bleed due to NSAIDs   Anemia: Microcytic with an MCV of 70. Hemoglobin 5.2. 2 months ago 7.2. Likely slow chronic process as patient is fairly asymptomatic. Patients description of red to dark stools for approximately one year concerning for GI etiology. Patient uses NSAIDs to to 4  days out of the week. May also have some underlying iron deficiency. Unlikely due to instruction but also possibility. Consult with Dr.Rehman who agrees with admission and GI workup. Also discussed case briefly with Dr. Glo Herring of GYN and will need formal consult if necessary. - Telemetry - Transfuse 2 units PRBCs  - Protonix drip - Nothing by mouth after midnight  - Follow-up GI recommendations and possible scope  - Anemia panel  - CBC in a.m.  - Hold all NSAIDs  - Stool Hemocult - Consider IV Iron after anemia panel  Headache: Based on patient's description headaches sound to be more tension type but may be migraine related. They also be related to anemia as frequent chronic headaches started around the same times as patient's drop in hemoglobin. Currently without headache. Discussed the likelihood of outpatient follow-up and use of several different  medical regimens to manage. - Tylenol - Flexeril  Tobacco: 1/2ppd - nicotine PRN  Code Status: FULL DVT Prophylaxis: SCD Family Communication: Sister, parents Disposition Plan: pending improvement  Lavilla Delamora J, MD Family Medicine [facility] [email or web address] Password [email or web address]

## 2015-04-10 NOTE — ED Provider Notes (Signed)
CSN: [id number]     Arrival date & time 04/10/15  1604 History   First MD Initiated Contact with Patient 04/10/15 1610     Chief Complaint  Patient presents with  . Abnormal Lab     HPI  Pt was seen at 1625. Per pt, c/o gradual onset and worsening of persistent "low Hgb" for the past 1+month. Pt states she had her Hgb checked approximately 1+ month ago at the clinic and "it was 7-something." Pt states she was seen in f/u today and was told her Hgb as "5.2." Has been associated with generalized fatigue. Pt was then instructed to come to the ED for further evaluation/admission. Pt states she has hx of recurrent anemia from unknown source. Pt states her stools are "black because I take iron." States she occasionally will see red blood in her stools "but my doctor told me it was hemorrhoids." Denies abd pain, no N/V/D, no recent rectal bleeding, no CP/SOB.   Past Medical History  Diagnosis Date  . Anxiety   . Migraine   . Hemorrhoids    History reviewed. No pertinent past surgical history.   Family History  Problem Relation Age of Onset  . Diabetes Mother   . Hypertension Mother   . Hypertension Father   . Breast cancer Maternal Aunt   . Diabetes Paternal Aunt   . Diabetes Maternal Grandmother   . Diabetes Paternal Aunt   . Stroke Maternal Aunt    History  Substance Use Topics  . Smoking status: Current Every Day Smoker -- 0.50 packs/day for 25 years  . Smokeless tobacco: Not on file  . Alcohol Use: Yes     Comment: occ   OB History    Gravida Para Term Preterm AB TAB SAB Ectopic Multiple Living   4 1 1  2  1   1      Review of Systems ROS: Statement: All systems negative except as marked or noted in the HPI; Constitutional: Negative for fever and chills. +generalized fatigue; ; Eyes: Negative for eye pain, redness and discharge. ; ; ENMT: Negative for ear pain, hoarseness, nasal congestion, sinus pressure and sore throat. ; ; Cardiovascular: Negative for chest pain,  palpitations, diaphoresis, dyspnea and peripheral edema. ; ; Respiratory: Negative for cough, wheezing and stridor. ; ; Gastrointestinal: Negative for nausea, vomiting, diarrhea, abdominal pain, blood in stool, hematemesis, jaundice and rectal bleeding. . ; ; Genitourinary: Negative for dysuria, flank pain and hematuria. ; ; Musculoskeletal: Negative for back pain and neck pain. Negative for swelling and trauma.; ; Skin: Negative for pruritus, rash, abrasions, blisters, bruising and skin lesion.; ; Neuro: Negative for headache, lightheadedness and neck stiffness. Negative for altered level of consciousness , altered mental status, extremity weakness, paresthesias, involuntary movement, seizure and syncope.      Allergies  Review of patient's allergies indicates no known allergies.  Home Medications   Prior to Admission medications   Medication Sig Start Date End Date Taking? Authorizing Provider  aspirin-acetaminophen-caffeine (EXCEDRIN EXTRA STRENGTH) [phone] MG per tablet Take 1-2 tablets by mouth every 6 (six) hours as needed for headache.   Yes Historical Provider, MD  iron polysaccharides (POLY-IRON 150) 150 MG capsule Take 150 mg by mouth daily.   Yes Historical Provider, MD  Iron Polysacch Cmplx-B12-FA 150-0.025-1 MG CAPS Take 1 capsule by mouth daily. Patient not taking: Reported on 04/10/2015 01/25/15   Patrici Ranks, MD   BP 139/96 mmHg  Pulse 90  Temp(Src) 99.4 F (37.4 C) (Oral)  Resp 16  Ht 5\' 8"  (1.727 m)  Wt 122 lb (55.339 kg)  BMI 18.55 kg/m2  SpO2 100%  LMP 03/21/2015 Physical Exam  1630: Physical examination:  Nursing notes reviewed; Vital signs and O2 SAT reviewed;  Constitutional: Well developed, Well nourished, Well hydrated, In no acute distress; Head:  Normocephalic, atraumatic; Eyes: EOMI, PERRL, No scleral icterus. Conjunctiva pale.; ENMT: Mouth and pharynx normal, Mucous membranes moist; Neck: Supple, Full range of motion, No lymphadenopathy; Cardiovascular:  Regular rate and rhythm, No gallop; Respiratory: Breath sounds clear & equal bilaterally, No wheezes.  Speaking full sentences with ease, Normal respiratory effort/excursion; Chest: Nontender, Movement normal; Abdomen: Soft, Nontender, Nondistended, Normal bowel sounds; Genitourinary: No CVA tenderness; Extremities: Pulses normal, No tenderness, No edema, No calf edema or asymmetry.; Neuro: AA&Ox3, Major CN grossly intact.  Speech clear. No gross focal motor or sensory deficits in extremities.; Skin: Color normal, Warm, Dry.   ED Course  Procedures      EKG Interpretation None      MDM  MDM Reviewed: previous chart, nursing note and vitals Reviewed previous: labs Interpretation: labs Total time providing critical care: 30-74 minutes. This excludes time spent performing separately reportable procedures and services. Consults: admitting MD   CRITICAL CARE Performed by: Alfonzo Feller Total critical care time: 35 Critical care time was exclusive of separately billable procedures and treating other patients. Critical care was necessary to treat or prevent imminent or life-threatening deterioration. Critical care was time spent personally by me on the following activities: development of treatment plan with patient and/or surrogate as well as nursing, discussions with consultants, evaluation of patient's response to treatment, examination of patient, obtaining history from patient or surrogate, ordering and performing treatments and interventions, ordering and review of laboratory studies, ordering and review of radiographic studies, pulse oximetry and re-evaluation of patient's condition.   Results for orders placed or performed during the hospital encounter of 51/76/16  Basic metabolic panel  Result Value Ref Range   Sodium 138 135 - 145 mmol/L   Potassium 3.8 3.5 - 5.1 mmol/L   Chloride 108 101 - 111 mmol/L   CO2 23 22 - 32 mmol/L   Glucose, Bld 87 65 - 99 mg/dL   BUN 10 6 -  20 mg/dL   Creatinine, Ser 0.71 0.44 - 1.00 mg/dL   Calcium 8.6 (L) 8.9 - 10.3 mg/dL   GFR calc non Af Amer >60 >60 mL/min   GFR calc Af Amer >60 >60 mL/min   Anion gap 7 5 - 15  CBC with Differential  Result Value Ref Range   WBC 5.6 4.0 - 10.5 K/uL   RBC 2.90 (L) 3.87 - 5.11 MIL/uL   Hemoglobin 5.9 (LL) 12.0 - 15.0 g/dL   HCT 20.4 (L) 36.0 - 46.0 %   MCV 70.3 (L) 78.0 - 100.0 fL   MCH 20.3 (L) 26.0 - 34.0 pg   MCHC 28.9 (L) 30.0 - 36.0 g/dL   RDW 22.1 (H) 11.5 - 15.5 %   Platelets 332 150 - 400 K/uL   Neutrophils Relative % 50 43 - 77 %   Neutro Abs 2.8 1.7 - 7.7 K/uL   Lymphocytes Relative 39 12 - 46 %   Lymphs Abs 2.2 0.7 - 4.0 K/uL   Monocytes Relative 8 3 - 12 %   Monocytes Absolute 0.4 0.1 - 1.0 K/uL   Eosinophils Relative 2 0 - 5 %   Eosinophils Absolute 0.1 0.0 - 0.7 K/uL   Basophils Relative 2 (H)  0 - 1 %   Basophils Absolute 0.1 0.0 - 0.1 K/uL   RBC Morphology SCHISTOCYTES NOTED ON SMEAR     1745:  PRBC transfusion ordered. Dx and testing d/w pt and family.  Questions answered.  Verb understanding, agreeable to admit. T/C to [facility] Dr. Marily Memos, case discussed, including:  HPI, pertinent PM/SHx, VS/PE, dx testing, ED course and treatment:  Agreeable to admit, requests to write temporary orders, obtain observation tele bed to team APAdmits.     Francine Graven, DO 04/11/15 1217

## 2015-04-11 ENCOUNTER — Telehealth (INDEPENDENT_AMBULATORY_CARE_PROVIDER_SITE_OTHER): Payer: Self-pay | Admitting: *Deleted

## 2015-04-11 ENCOUNTER — Other Ambulatory Visit (INDEPENDENT_AMBULATORY_CARE_PROVIDER_SITE_OTHER): Payer: Self-pay | Admitting: *Deleted

## 2015-04-11 DIAGNOSIS — D509 Iron deficiency anemia, unspecified: Secondary | ICD-10-CM

## 2015-04-11 DIAGNOSIS — K922 Gastrointestinal hemorrhage, unspecified: Secondary | ICD-10-CM

## 2015-04-11 DIAGNOSIS — Z791 Long term (current) use of non-steroidal anti-inflammatories (NSAID): Secondary | ICD-10-CM

## 2015-04-11 DIAGNOSIS — Z720 Tobacco use: Secondary | ICD-10-CM

## 2015-04-11 LAB — CBC
HEMATOCRIT: 26 % — AB (ref 36.0–46.0)
HEMOGLOBIN: 8 g/dL — AB (ref 12.0–15.0)
MCH: 23.2 pg — ABNORMAL LOW (ref 26.0–34.0)
MCHC: 30.8 g/dL (ref 30.0–36.0)
MCV: 75.4 fL — ABNORMAL LOW (ref 78.0–100.0)
PLATELETS: 254 10*3/uL (ref 150–400)
RBC: 3.45 MIL/uL — ABNORMAL LOW (ref 3.87–5.11)
RDW: 21.8 % — ABNORMAL HIGH (ref 11.5–15.5)
WBC: 5.7 10*3/uL (ref 4.0–10.5)

## 2015-04-11 LAB — FOLATE: Folate: 8.3 ng/mL (ref >5.9)

## 2015-04-11 MED ORDER — PANTOPRAZOLE SODIUM 40 MG PO TBEC: 40.0000 mg | DELAYED_RELEASE_TABLET | Freq: Every day | ORAL | Status: DC

## 2015-04-11 MED ORDER — PANTOPRAZOLE SODIUM 40 MG PO TBEC
40.0000 mg | DELAYED_RELEASE_TABLET | Freq: Every day | ORAL | Status: DC
  Administered 2015-04-11: 40 mg via ORAL
  Filled 2015-04-11: qty 1

## 2015-04-11 MED ORDER — PANTOPRAZOLE SODIUM 40 MG IV SOLR
INTRAVENOUS | Status: AC
  Filled 2015-04-11: qty 80

## 2015-04-11 NOTE — Consult Note (Signed)
Referring Provider: No ref. provider found Primary Care Physician:  No PCP Per Patient Primary Gastroenterologist:  Dr. Laural Golden  Reason for Consultation:    Iron deficiency anemia.  HPI:   Patient is 44 year old African-American female who was admitted to hospitalist service yesterday for hemoglobin of 5.9 g. She has been transfused 2 units of PRBCs and hemoglobin this morning was 8 g. Patient has history of bilateral breast lumps and has been evaluated at [facility] free clinic. About 3 months ago she was noted to have iron deficiency anemia and begun on poly-iron. She had lab studies recently her hemoglobin was less than 6 g and she was therefore sent to the hospital for evaluation. Patient states she has had intermittent hematochezia for about a year felt to be due to hemorrhoids. She generally passes small amount of bright red blood occasionally dark in color with her bowel movements. Her bowels move every day she denies diarrhea constipation abdominal pain nausea vomiting heartburn or dysphagia. She has very good appetite. She has gained 5 pounds recently. She has history of migraine and takes Advil and/or Excedrin but it is not very often. However she is taking Excedrin daily for the last few days. She denies hematuria or melena. She says her periods are rather heavy. Her. She was last for 7 days each month and then gradually dropped to 3 days each month and now she's back to 7 days each month. She has craving for ice. Stool guaiac is pending.  Family history is negative for IBD or CRC. Father has hypertension and mother has hypertension and diabetes. She has 2 brothers and one sister in good health. She has one daughter in good health was at bedside. She has never been married. She drinks alcohol occasionally. She smokes but half a pack a day and she has done since she was 44 years old. She is going to [employer] and taking nursing courses.  Past medical history; History of lumps in both breasts  felt to be of benign etiology. History of hemorrhoids.      Prior to Admission medications   Medication Sig Start Date End Date Taking? Authorizing Provider  aspirin-acetaminophen-caffeine (EXCEDRIN EXTRA STRENGTH) [phone] MG per tablet Take 1-2 tablets by mouth every 6 (six) hours as needed for headache.   Yes Historical Provider, MD  iron polysaccharides (POLY-IRON 150) 150 MG capsule Take 150 mg by mouth daily.   Yes Historical Provider, MD  Iron Polysacch Cmplx-B12-FA 150-0.025-1 MG CAPS Take 1 capsule by mouth daily. Patient not taking: Reported on 04/10/2015 01/25/15   Patrici Ranks, MD    Current Facility-Administered Medications  Medication Dose Route Frequency Provider Last Rate Last Dose  . 0.9 %  sodium chloride infusion  250 mL Intravenous PRN Waldemar Dickens, MD      . acetaminophen (TYLENOL) tablet 1,000 mg  1,000 mg Oral Q8H PRN Waldemar Dickens, MD       Or  . acetaminophen (TYLENOL) suppository 975 mg  975 mg Rectal Q6H PRN Waldemar Dickens, MD      . cyclobenzaprine (FLEXERIL) tablet 5 mg  5 mg Oral TID PRN Waldemar Dickens, MD      . nicotine (NICODERM CQ - dosed in mg/24 hours) patch 14 mg  14 mg Transdermal Daily PRN Waldemar Dickens, MD      . ondansetron [facility]) tablet 4 mg  4 mg Oral Q6H PRN Waldemar Dickens, MD       Or  . ondansetron [facility])  injection 4 mg  4 mg Intravenous Q6H PRN Waldemar Dickens, MD      . pantoprazole (PROTONIX) 80 mg in sodium chloride 0.9 % 250 mL (0.32 mg/mL) infusion  8 mg/hr Intravenous Continuous Waldemar Dickens, MD 25 mL/hr at 04/10/15 2043 8 mg/hr at 04/10/15 2043  . [START ON 04/14/2015] pantoprazole (PROTONIX) injection 40 mg  40 mg Intravenous Q12H Waldemar Dickens, MD      . sodium chloride 0.9 % injection 3 mL  3 mL Intravenous Q12H Waldemar Dickens, MD   3 mL at 04/10/15 2200  . sodium chloride 0.9 % injection 3 mL  3 mL Intravenous Q12H Waldemar Dickens, MD   0 mL at 04/10/15 2200  . sodium chloride 0.9 % injection 3 mL  3 mL  Intravenous PRN Waldemar Dickens, MD        Allergies as of 04/10/2015  . (No Known Allergies)    Family History  Problem Relation Age of Onset  . Diabetes Mother   . Hypertension Mother   . Hypertension Father   . Breast cancer Maternal Aunt   . Diabetes Paternal Aunt   . Diabetes Maternal Grandmother   . Diabetes Paternal Aunt   . Stroke Maternal Aunt     History   Social History  . Marital Status: Single    Spouse Name: N/A  . Number of Children: N/A  . Years of Education: N/A   Occupational History  . Not on file.   Social History Main Topics  . Smoking status: Current Every Day Smoker -- 0.50 packs/day for 25 years  . Smokeless tobacco: Not on file  . Alcohol Use: Yes     Comment: occ  . Drug Use: No  . Sexual Activity: Yes    Birth Control/ Protection: None   Other Topics Concern  . Not on file   Social History Narrative    Review of Systems: See HPI, otherwise normal ROS  Physical Exam: Temp:  [98.4 F (36.9 C)-99.4 F (37.4 C)] 98.5 F (36.9 C) (08/02 0625) Pulse Rate:  [69-90] 70 (08/02 0625) Resp:  [16-20] 20 (08/02 0625) BP: (120-139)/(72-96) 125/72 mmHg (08/02 0625) SpO2:  [100 %] 100 % (08/02 0625) Weight:  [120 lb 2.4 oz (54.5 kg)-122 lb (55.339 kg)] 120 lb 2.4 oz (54.5 kg) (08/01 2029) Last BM Date: 04/10/15 Patient is alert and in no acute distress. Conjunctiva is pale and sclerae nonicteric. Oropharyngeal mucosa is normal. No neck masses or thyromegaly noted. Cardiac exam with regular rhythm normal S1 and S2. No murmur or gallop noted. Lungs are clear to auscultation. Abdomen is full but soft and nontender without organomegaly or masses. Rectal examination deferred No peripheral edema clubbing or spleen she spoon shaped nails.  Lab Results:  Recent Labs  04/10/15 1634 04/11/15 0520  WBC 5.6 5.7  HGB 5.9* 8.0*  HCT 20.4* 26.0*  PLT 332 254   BMET  Recent Labs  04/10/15 1634  NA 138  K 3.8  CL 108  CO2 23  GLUCOSE  87  BUN 10  CREATININE 0.71  CALCIUM 8.6*   Serum iron 7, TIBC 504 and saturation 1% Serum ferritin 1 B12 level CCCLVII and folate level is pending.  Assessment;  Patient is 44 year old African-American female who is being followed a [place]  free clinic for bilateral breast lumps felt to be benign. She was noted to have iron deficiency anemia on routine testing and has not responded to by mouth iron. Hemoglobin  on admission was 5.9 g and she has received 2 units and hemoglobin is now 8 g. Suspect iron deficiency anemia secondary to heavy periods. Given history of hematochezia and history of Excedrin ingestion upper and lower GI tract needs to be evaluated to rule out peptic ulcer disease and/or colonic neoplasm.  Recommendations;  Discontinue iron. Discontinue pantoprazole infusion and begin pantoprazole 40 mg by mouth daily. Will plan esophagogastroduodenoscopy and colonoscopy to worse and of next week. Will check H&H when she returns for endoscopic evaluation.     REHMAN,NAJEEB U  04/11/2015, 7:38 AM

## 2015-04-11 NOTE — Telephone Encounter (Signed)
Patient needs trilyte 

## 2015-04-11 NOTE — Discharge Summary (Signed)
Physician Discharge Summary  JEANEAN HOLLETT ZOX:[id number] DOB: [birth year] DOA: 04/10/2015  PCP: No PCP Per Patient  Admit date: 04/10/2015 Discharge date: 04/11/2015  Time spent: 35 minutes   Recommendations for Outpatient Follow-up:  -EGD in one week follow-up with Dr Laural Golden. -Check CBC in one week.  Discharge Diagnoses:  Principal Problem:   Microcytic anemia Active Problems:   Chronic headaches   Tobacco use   GI bleed due to NSAIDs   Discharge Condition: Improved  Filed Weights   04/10/15 1607 04/10/15 2029  Weight: 55.339 kg (122 lb) 54.5 kg (120 lb 2.4 oz)    History of present illness:  Patient is 44 year old African-American female who was admitted to hospital 04/10/15 for hemoglobin of 5.9 . She was transfused 2 units of PRBCs yesterday and hemoglobin was 8 this morning. Patient has history of bilateral breast lumps and has been evaluated at [facility] free clinic. About 3 months ago she was noted to have iron deficiency anemia and was begun on poly-iron.   Hospital Course:   Chronic blood loss anemia -HGB 5.2 on admission up to 8 after 2 units of PRBC. -Etiology likely menstrual loss -Seen by GI, will follow-up in one week for EGD and colonoscopy and recheck CBC. -Will start Protonix 40mg  daily at that time.   Tobacco Abuse -Counseled on cessation.   Procedures:  None  Consultations: GI-Dr Laural Golden  Discharge Instructions     Medication List    ASK your doctor about these medications        EXCEDRIN EXTRA STRENGTH 250-250-65 MG per tablet  Generic drug:  aspirin-acetaminophen-caffeine  Take 1-2 tablets by mouth every 6 (six) hours as needed for headache.     Iron Polysacch Cmplx-B12-FA 150-0.025-1 MG Caps  Take 1 capsule by mouth daily.     POLY-IRON 150 150 MG capsule  Generic drug:  iron polysaccharides  Take 150 mg by mouth daily.       No Known Allergies    The results of significant diagnostics from this hospitalization  (including imaging, microbiology, ancillary and laboratory) are listed below for reference.    Significant Diagnostic Studies: No results found.  Microbiology: No results found for this or any previous visit (from the past 240 hour(s)).   Labs: Basic Metabolic Panel:  Recent Labs Lab 04/10/15 1634  NA 138  K 3.8  CL 108  CO2 23  GLUCOSE 87  BUN 10  CREATININE 0.71  CALCIUM 8.6*   Liver Function Tests: No results for input(s): AST, ALT, ALKPHOS, BILITOT, PROT, ALBUMIN in the last 168 hours. No results for input(s): LIPASE, AMYLASE in the last 168 hours. No results for input(s): AMMONIA in the last 168 hours. CBC:  Recent Labs Lab 04/10/15 1634 04/11/15 0520  WBC 5.6 5.7  NEUTROABS 2.8  --   HGB 5.9* 8.0*  HCT 20.4* 26.0*  MCV 70.3* 75.4*  PLT 332 254   Cardiac Enzymes: No results for input(s): CKTOTAL, CKMB, CKMBINDEX, TROPONINI in the last 168 hours. BNP: BNP (last 3 results) No results for input(s): BNP in the last 8760 hours.  ProBNP (last 3 results) No results for input(s): PROBNP in the last 8760 hours.  CBG: No results for input(s): GLUCAP in the last 168 hours.     Signed:  Dr. Domingo Mend, MD   [facility] Pager: [phone] 04/11/2015, 8:22 AM   I, Laban Emperor. Leonie Green, acting as scribe, recorded this note contemporaneously in the presence of Dr. Lelon Frohlich, M.D. on  04/11/2015 at 8:22 AM   I have reviewed the above documentation for accuracy and completeness, and I agree with the above.  Domingo Mend, MD [facility] Pager: [phone]

## 2015-04-11 NOTE — Care Management Note (Signed)
Case Management Note  Patient Details  Name: LIVIYA SANTINI MRN: [id number] Date of Birth: 1971-07-26  Expected Discharge Date:  04/12/15               Expected Discharge Plan:  Home/Self Care  In-House Referral:  Financial Counselor  Discharge planning Services  CM Consult  Post Acute Care Choice:  NA Choice offered to:  NA  DME Arranged:    DME Agency:     HH Arranged:    Julian Agency:     Status of Service:  Completed, signed off  Medicare Important Message Given:    Date Medicare IM Given:    Medicare IM give by:    Date Additional Medicare IM Given:    Additional Medicare Important Message give by:     If discussed at [place] of Stay Meetings, dates discussed:    Additional Comments: Patient discharge home today with self care. Pt from home and independent at baseline. Pt says she is uninsured but has applied for medicaid. Financial counselor referred to check on medicaid application. Pt follows at the [facility] and will f/u specialist. No CM needs.  Sherald Barge, RN 04/11/2015, 11:16 AM

## 2015-04-11 NOTE — Progress Notes (Signed)
Patient with orders to be discharge home. Discharge instructions given, patient verbalized understanding. Prescriptions given. Patient stable. Patient left in private vehicle with family.  

## 2015-04-12 LAB — TYPE AND SCREEN
ABO/RH(D): A POS
ANTIBODY SCREEN: NEGATIVE
Unit division: 0
Unit division: 0

## 2015-04-12 MED ORDER — PEG 3350-KCL-NA BICARB-NACL 420 G PO SOLR: 4000.0000 mL | Freq: Once | ORAL | Status: DC

## 2015-04-21 ENCOUNTER — Encounter (HOSPITAL_COMMUNITY)
Admission: RE | Disposition: A | Discharge status: Home / Self Care | Payer: Self-pay | Source: Ambulatory Visit | Attending: Internal Medicine

## 2015-04-21 ENCOUNTER — Ambulatory Visit (HOSPITAL_COMMUNITY)
Admission: RE | Admit: 2015-04-21 | Discharge: 2015-04-21 | Disposition: A | Discharge status: Home / Self Care | Payer: Medicaid Other | Source: Ambulatory Visit | Attending: Internal Medicine | Admitting: Internal Medicine

## 2015-04-21 ENCOUNTER — Encounter (HOSPITAL_COMMUNITY): Payer: Self-pay

## 2015-04-21 DIAGNOSIS — D509 Iron deficiency anemia, unspecified: Secondary | ICD-10-CM

## 2015-04-21 DIAGNOSIS — G43909 Migraine, unspecified, not intractable, without status migrainosus: Secondary | ICD-10-CM | POA: Insufficient documentation

## 2015-04-21 DIAGNOSIS — D123 Benign neoplasm of transverse colon: Secondary | ICD-10-CM | POA: Insufficient documentation

## 2015-04-21 DIAGNOSIS — F17210 Nicotine dependence, cigarettes, uncomplicated: Secondary | ICD-10-CM | POA: Diagnosis not present

## 2015-04-21 DIAGNOSIS — K5900 Constipation, unspecified: Secondary | ICD-10-CM | POA: Diagnosis not present

## 2015-04-21 DIAGNOSIS — F419 Anxiety disorder, unspecified: Secondary | ICD-10-CM | POA: Insufficient documentation

## 2015-04-21 DIAGNOSIS — K644 Residual hemorrhoidal skin tags: Secondary | ICD-10-CM | POA: Insufficient documentation

## 2015-04-21 DIAGNOSIS — K2960 Other gastritis without bleeding: Secondary | ICD-10-CM | POA: Insufficient documentation

## 2015-04-21 DIAGNOSIS — K6389 Other specified diseases of intestine: Secondary | ICD-10-CM | POA: Insufficient documentation

## 2015-04-21 DIAGNOSIS — K921 Melena: Secondary | ICD-10-CM

## 2015-04-21 DIAGNOSIS — K648 Other hemorrhoids: Secondary | ICD-10-CM | POA: Insufficient documentation

## 2015-04-21 HISTORY — PX: COLONOSCOPY: SHX5424

## 2015-04-21 HISTORY — PX: ESOPHAGOGASTRODUODENOSCOPY: SHX5428

## 2015-04-21 SURGERY — COLONOSCOPY
Anesthesia: Moderate Sedation

## 2015-04-21 MED ORDER — FERROUS SULFATE 325 (65 FE) MG PO TABS: 325.0000 mg | ORAL_TABLET | Freq: Two times a day (BID) | ORAL | Status: DC

## 2015-04-21 MED ORDER — STERILE WATER FOR IRRIGATION IR SOLN
Status: DC | PRN
  Administered 2015-04-21: 14:00:00

## 2015-04-21 MED ORDER — POLYETHYLENE GLYCOL 3350 17 GM/SCOOP PO POWD: 8.5000 g | Freq: Every day | ORAL | Status: DC

## 2015-04-21 MED ORDER — SODIUM CHLORIDE 0.9 % IV SOLN
INTRAVENOUS | Status: DC
  Administered 2015-04-21: 13:00:00 via INTRAVENOUS

## 2015-04-21 MED ORDER — MEPERIDINE HCL 50 MG/ML IJ SOLN
INTRAMUSCULAR | Status: AC
  Filled 2015-04-21: qty 1

## 2015-04-21 MED ORDER — MIDAZOLAM HCL 5 MG/5ML IJ SOLN
INTRAMUSCULAR | Status: AC
  Filled 2015-04-21: qty 10

## 2015-04-21 MED ORDER — MEPERIDINE HCL 50 MG/ML IJ SOLN
INTRAMUSCULAR | Status: DC | PRN
  Administered 2015-04-21 (×3): 25 mg via INTRAVENOUS

## 2015-04-21 MED ORDER — MIDAZOLAM HCL 5 MG/5ML IJ SOLN
INTRAMUSCULAR | Status: DC | PRN
  Administered 2015-04-21 (×2): 3 mg via INTRAVENOUS
  Administered 2015-04-21 (×2): 2 mg via INTRAVENOUS

## 2015-04-21 MED ORDER — BUTAMBEN-TETRACAINE-BENZOCAINE 2-2-14 % EX AERO
INHALATION_SPRAY | CUTANEOUS | Status: DC | PRN
  Administered 2015-04-21: 2 via TOPICAL

## 2015-04-21 MED ORDER — BENEFIBER DRINK MIX PO PACK: 4.0000 g | PACK | Freq: Every day | ORAL | Status: DC

## 2015-04-21 NOTE — H&P (Signed)
Sheila Berg is an 44 y.o. female.   Chief Complaint: Patient is here for EGD and colonoscopy. HPI: Patient is 44 year old African-American female who was briefly hospitalized about 10 days ago with hemoglobin of 5.9 g. She was given 2 units of PRBCs. Hemoglobin is up-to-date grams. She has history of intermittent hematochezia. Times she passes large amount of blood. She was seen in emergency room on one occasion and felt to have hemorrhoidal bleeding. He still having periods which are normal to heavy. She has history of headache and takes Excedrin which is not very often. There is no history of peptic ulcer disease or melena. Family history is negative for CRC.   Past Medical History  Diagnosis Date  . Anxiety   . Migraine   . Hemorrhoids     Past Surgical History  Procedure Laterality Date  . No past surgeries      Family History  Problem Relation Age of Onset  . Diabetes Mother   . Hypertension Mother   . Hypertension Father   . Breast cancer Maternal Aunt   . Diabetes Paternal Aunt   . Diabetes Maternal Grandmother   . Diabetes Paternal Aunt   . Stroke Maternal Aunt    Social History:  reports that she has been smoking.  She does not have any smokeless tobacco history on file. She reports that she drinks alcohol. She reports that she does not use illicit drugs.  Allergies: No Known Allergies  Medications Prior to Admission  Medication Sig Dispense Refill  . pantoprazole (PROTONIX) 40 MG tablet Take 1 tablet (40 mg total) by mouth daily. 30 tablet 1  . polyethylene glycol-electrolytes (NULYTELY/GOLYTELY) 420 G solution Take 4,000 mLs by mouth once. 4000 mL 0    No results found for this or any previous visit (from the past 48 hour(s)). No results found.  ROS  Blood pressure 151/98, pulse 57, temperature 98.9 F (37.2 C), temperature source Oral, resp. rate 20, last menstrual period 04/19/2015, SpO2 100 %. Physical Exam  Constitutional:  Well-developed thin  African-American female in NAD  HENT:  Mouth/Throat: Oropharynx is clear and moist.  Eyes: Conjunctivae are normal. No scleral icterus.  Neck: No thyromegaly present.  Cardiovascular: Normal rate, regular rhythm and normal heart sounds.   No murmur heard. Respiratory: Effort normal and breath sounds normal.  GI: Soft. She exhibits no distension and no mass. There is no tenderness.  Musculoskeletal: She exhibits no edema.  Lymphadenopathy:    She has no cervical adenopathy.  Neurological: She is alert.  Skin: Skin is warm and dry.     Assessment/Plan Iron deficiency anemia. Rectal bleeding. Diagnostic EGD and colonoscopy.  Yuvia Plant U 04/21/2015, 1:32 PM

## 2015-04-21 NOTE — Op Note (Signed)
EGD PROCEDURE REPORT  PATIENT:  Sheila Berg  MR#:  [id number] Birthdate:  [birth year], 44 y.o., female Endoscopist:  Dr. Rogene Houston, MD Referred By:  Ms. Jacqualine Mau, PA-C  Procedure Date: 04/21/2015  Procedure:   EGD & Colonoscopy  Indications:  Patient is 44 year old African female who presents with iron deficiency anemia and recurrent hematochezia. She has chronic constipation. She also takes Excedrin intermittently for headache. She was briefly hospitalized about 10 days ago C2 units of PRBCs and her hemoglobin has come up from 5.9 g to 8 g.            Informed Consent:  The risks, benefits, alternatives & imponderables which include, but are not limited to, bleeding, infection, perforation, drug reaction and potential missed lesion have been reviewed.  The potential for biopsy, lesion removal, esophageal dilation, etc. have also been discussed.  Questions have been answered.  All parties agreeable.  Please see history & physical in medical record for more information.  Medications:  Demerol 75 mg IV Versed 10 mg IV Cetacaine spray topically for oropharyngeal anesthesia  EGD  Description of procedure:  The endoscope was introduced through the mouth and advanced to the second portion of the duodenum without difficulty or limitations. The mucosal surfaces were surveyed very carefully during advancement of the scope and upon withdrawal.  Findings:  Esophagus:  Cozaar of the esophagus normal. GE junction was unremarkable. GEJ:  40 cm Stomach:  Stomach was empty and distended very well with insufflation. Folds in the proximal stomach were normal. Examination of mucosa at gastric body was normal. Few antral erosions noted. Pyloric channel was patent. Angularis fundus and cardia were examined by retroflexion of scope were normal. Duodenum:  Normal bulbar and post bulbar mucosa.  Therapeutic/Diagnostic Maneuvers Performed:  None  COLONOSCOPY Description of procedure:  After a  digital rectal exam was performed, that colonoscope was advanced from the anus through the rectum and colon to the area of the cecum, ileocecal valve and appendiceal orifice. The cecum was deeply intubated. These structures were well-seen and photographed for the record. From the level of the cecum and ileocecal valve, the scope was slowly and cautiously withdrawn. The mucosal surfaces were carefully surveyed utilizing scope tip to flexion to facilitate fold flattening as needed. The scope was pulled down into the rectum where a thorough exam including retroflexion was performed.  Findings:   Prep excellent. Redundant colon. Small polyp ablated via cold biopsy from hepatic flexure. Normal rectal mucosa. Large hemorrhoids above and below the dentate line.  Therapeutic/Diagnostic Maneuvers Performed:  See above  Complications:  None  Cecal Withdrawal Time:  11 minutes  Impression:  EGD findings: Erosive antral gastritis otherwise normal EGD. This finding is possibly due to NSAID use but will rule out H. pylori infection.  Colonoscopy findings: Examination performed to cecum. Small polyp ablated via cold biopsy from hepatic flexure. Large internal/external hemorrhoids.  Recommendations:  Ferrous sulfate 325 mg by mouth twice a day. Benefiber 4 g by mouth daily at bedtime. Polythene glycol 17 g by mouth daily at bedtime. H. pylori serology. I will be contacting patient results of biopsy and blood test next week.  REHMAN,NAJEEB U  04/21/2015 2:31 PM  CC: Dr. Rayne Du PCP Per Patient & Dr. No ref. provider found CC; Ms. Jacqualine Mau, PA-C

## 2015-04-21 NOTE — Discharge Instructions (Signed)
Resume usual medications and high fiber diet. Ferrous sulfate 325 mg by mouth twice a day with food. Benefiber 4 g by mouth daily at bedtime. Polyethylene glycol 17 g by mouth daily at bedtime. No driving for 24 hours. Physician will call with results of blood tests and biopsy.   Colonoscopy, Care After These instructions give you information on caring for yourself after your procedure. Your doctor may also give you more specific instructions. Call your doctor if you have any problems or questions after your procedure. HOME CARE  Do not drive for 24 hours.  Do not sign important papers or use machinery for 24 hours.  You may shower.  You may go back to your usual activities, but go slower for the first 24 hours.  Take rest breaks often during the first 24 hours.  Walk around or use warm packs on your belly (abdomen) if you have belly cramping or gas.  Drink enough fluids to keep your pee (urine) clear or pale yellow.  Resume your normal diet. Avoid heavy or fried foods.  Avoid drinking alcohol for 24 hours or as told by your doctor.  Only take medicines as told by your doctor. If a tissue sample (biopsy) was taken during the procedure:   Do not take aspirin or blood thinners for 7 days, or as told by your doctor.  Do not drink alcohol for 7 days, or as told by your doctor.  Eat soft foods for the first 24 hours. GET HELP IF: You still have a small amount of blood in your poop (stool) 2-3 days after the procedure. GET HELP RIGHT AWAY IF:  You have more than a small amount of blood in your poop.  You see clumps of tissue (blood clots) in your poop.  Your belly is puffy (swollen).  You feel sick to your stomach (nauseous) or throw up (vomit).  You have a fever.  You have belly pain that gets worse and medicine does not help. MAKE SURE YOU:  Understand these instructions.  Will watch your condition.  Will get help right away if you are not doing well or get  worse. Document Released: 09/28/2010 Document Revised: 08/31/2013 Document Reviewed: 05/03/2013 [facility] Patient Information 2015 [place], [place]. This information is not intended to replace advice given to you by your health care provider. Make sure you discuss any questions you have with your health care provider.   Esophagogastroduodenoscopy Care After Refer to this sheet in the next few weeks. These instructions provide you with information on caring for yourself after your procedure. Your caregiver may also give you more specific instructions. Your treatment has been planned according to current medical practices, but problems sometimes occur. Call your caregiver if you have any problems or questions after your procedure.  HOME CARE INSTRUCTIONS  Do not eat or drink anything until the numbing medicine (local anesthetic) has worn off and your gag reflex has returned. You will know that the local anesthetic has worn off when you can swallow comfortably.  Do not drive for 12 hours after the procedure or as directed by your caregiver.  Only take medicines as directed by your caregiver. SEEK MEDICAL CARE IF:   You cannot stop coughing.  You are not urinating at all or less than usual. SEEK IMMEDIATE MEDICAL CARE IF:  You have difficulty swallowing.  You cannot eat or drink.  You have worsening throat or chest pain.  You have dizziness, lightheadedness, or you faint.  You have nausea or vomiting.  You have chills.  You have a fever.  You have severe abdominal pain.  You have black, tarry, or bloody stools. Document Released: 08/12/2012 Document Reviewed: 08/12/2012 [employer] Patient Information 2015 [place]. This information is not intended to replace advice given to you by your health care provider. Make sure you discuss any questions you have with your health care provider. High-Fiber Diet Fiber is found in fruits, vegetables, and grains. A high-fiber diet encourages  the addition of more whole grains, legumes, fruits, and vegetables in your diet. The recommended amount of fiber for adult males is 38 g per day. For adult females, it is 25 g per day. Pregnant and lactating women should get 28 g of fiber per day. If you have a digestive or bowel problem, ask your caregiver for advice before adding high-fiber foods to your diet. Eat a variety of high-fiber foods instead of only a select few type of foods.  PURPOSE  To increase stool bulk.  To make bowel movements more regular to prevent constipation.  To lower cholesterol.  To prevent overeating. WHEN IS THIS DIET USED?  It may be used if you have constipation and hemorrhoids.  It may be used if you have uncomplicated diverticulosis (intestine condition) and irritable bowel syndrome.  It may be used if you need help with weight management.  It may be used if you want to add it to your diet as a protective measure against atherosclerosis, diabetes, and cancer. SOURCES OF FIBER  Whole-grain breads and cereals.  Fruits, such as apples, oranges, bananas, berries, prunes, and pears.  Vegetables, such as green peas, carrots, sweet potatoes, beets, broccoli, cabbage, spinach, and artichokes.  Legumes, such split peas, soy, lentils.  Almonds. FIBER CONTENT IN FOODS Starches and Grains / Dietary Fiber (g)  Cheerios, 1 cup / 3 g  Corn Flakes cereal, 1 cup / 0.7 g  Rice crispy treat cereal, 1 cup / 0.3 g  Instant oatmeal (cooked),  cup / 2 g  Frosted wheat cereal, 1 cup / 5.1 g  Brown, long-grain rice (cooked), 1 cup / 3.5 g  White, long-grain rice (cooked), 1 cup / 0.6 g  Enriched macaroni (cooked), 1 cup / 2.5 g Legumes / Dietary Fiber (g)  Baked beans (canned, plain, or vegetarian),  cup / 5.2 g  Kidney beans (canned),  cup / 6.8 g  Pinto beans (cooked),  cup / 5.5 g Breads and Crackers / Dietary Fiber (g)  Plain or honey graham crackers, 2 squares / 0.7 g  Saltine crackers, 3  squares / 0.3 g  Plain, salted pretzels, 10 pieces / 1.8 g  Whole-wheat bread, 1 slice / 1.9 g  White bread, 1 slice / 0.7 g  Raisin bread, 1 slice / 1.2 g  Plain bagel, 3 oz / 2 g  Flour tortilla, 1 oz / 0.9 g  Corn tortilla, 1 small / 1.5 g  Hamburger or hotdog bun, 1 small / 0.9 g Fruits / Dietary Fiber (g)  Apple with skin, 1 medium / 4.4 g  Sweetened applesauce,  cup / 1.5 g  Banana,  medium / 1.5 g  Grapes, 10 grapes / 0.4 g  Orange, 1 small / 2.3 g  Raisin, 1.5 oz / 1.6 g  Melon, 1 cup / 1.4 g Vegetables / Dietary Fiber (g)  Green beans (canned),  cup / 1.3 g  Carrots (cooked),  cup / 2.3 g  Broccoli (cooked),  cup / 2.8 g  Peas (cooked),  cup /  4.4 g  Mashed potatoes,  cup / 1.6 g  Lettuce, 1 cup / 0.5 g  Corn (canned),  cup / 1.6 g  Tomato,  cup / 1.1 g Document Released: 08/26/2005 Document Revised: 02/25/2012 Document Reviewed: 11/28/2011 [facility] Patient Information 2015 [place], [place]. This information is not intended to replace advice given to you by your health care provider. Make sure you discuss any questions you have with your health care provider.

## 2015-04-22 LAB — H. PYLORI ANTIBODY, IGG: H Pylori IgG: <0.9 U/mL (ref 0.0–0.8)

## 2015-04-25 ENCOUNTER — Encounter (HOSPITAL_COMMUNITY): Payer: Self-pay | Admitting: Internal Medicine

## 2015-05-16 ENCOUNTER — Encounter (INDEPENDENT_AMBULATORY_CARE_PROVIDER_SITE_OTHER): Payer: Self-pay | Admitting: *Deleted

## 2015-05-16 ENCOUNTER — Telehealth (INDEPENDENT_AMBULATORY_CARE_PROVIDER_SITE_OTHER): Payer: Self-pay | Admitting: *Deleted

## 2015-05-16 DIAGNOSIS — D509 Iron deficiency anemia, unspecified: Secondary | ICD-10-CM

## 2015-05-16 NOTE — Telephone Encounter (Signed)
Per Dr.Rehman the patient will need to have labs drawn in 2 weeks. 

## 2015-06-15 ENCOUNTER — Other Ambulatory Visit: Payer: Self-pay | Admitting: Physician Assistant

## 2015-06-15 LAB — HEMOGLOBIN: HEMOGLOBIN: 11.1 g/dL — AB (ref 12.0–15.0)

## 2015-06-15 LAB — HEMATOCRIT: HCT: 36.5 % (ref 36.0–46.0)

## 2015-07-17 ENCOUNTER — Encounter: Payer: Self-pay | Admitting: Adult Health

## 2015-07-17 ENCOUNTER — Ambulatory Visit (INDEPENDENT_AMBULATORY_CARE_PROVIDER_SITE_OTHER): Payer: Medicaid Other | Admitting: Adult Health

## 2015-07-17 VITALS — BP 140/90 | HR 70 | Ht 67.0 in | Wt 119.0 lb

## 2015-07-17 DIAGNOSIS — Z3009 Encounter for other general counseling and advice on contraception: Secondary | ICD-10-CM | POA: Diagnosis not present

## 2015-07-17 HISTORY — DX: Encounter for other general counseling and advice on contraception: Z30.09

## 2015-07-17 NOTE — Progress Notes (Signed)
Subjective:     Patient ID: Sheila Berg, female   DOB: 1971/07/19, 44 y.o.   MRN: [id number]  HPI Sheila Berg is a 44 year old single black female in to discuss birth Sheila Berg is interested nexplanon. She has 13 year old daughter and does not want more kids.She is in school at [employer].She had normal pap with negative HPV 01/24/15.  Review of Systems Patient denies any headaches, hearing loss, fatigue, blurred vision, shortness of breath, chest pain, abdominal pain, problems with bowel movements, urination, or intercourse. No joint pain or mood swings. Reviewed past medical,surgical, social and family history. Reviewed medications and allergies.     Objective:   Physical Exam BP 140/90 mmHg  Pulse 70  Ht 5\' 7"  (1.702 m)  Wt 119 lb (53.978 kg)  BMI 18.63 kg/m2  LMP 07/05/2015 Skin warm and dry.  Lungs: clear to ausculation bilaterally. Cardiovascular: regular rate and rhythm.Discussed nexplanon,depo and tubal and she wants nexplanon for now.    Assessment:     Contraceptive counseling    Plan:    Order nexplanon Return 11/21 for nexplanon insertion with me Review handout on nexplanon

## 2015-07-17 NOTE — Patient Instructions (Signed)
Will order nexplanon  return 11/21 for insertion

## 2015-07-31 ENCOUNTER — Ambulatory Visit (INDEPENDENT_AMBULATORY_CARE_PROVIDER_SITE_OTHER): Payer: Medicaid Other | Admitting: Adult Health

## 2015-07-31 ENCOUNTER — Encounter: Payer: Self-pay | Admitting: Adult Health

## 2015-07-31 VITALS — BP 140/78 | HR 68 | Ht 68.0 in | Wt 121.0 lb

## 2015-07-31 DIAGNOSIS — Z3202 Encounter for pregnancy test, result negative: Secondary | ICD-10-CM

## 2015-07-31 DIAGNOSIS — R030 Elevated blood-pressure reading, without diagnosis of hypertension: Secondary | ICD-10-CM

## 2015-07-31 DIAGNOSIS — Z309 Encounter for contraceptive management, unspecified: Secondary | ICD-10-CM | POA: Diagnosis not present

## 2015-07-31 DIAGNOSIS — Z30017 Encounter for initial prescription of implantable subdermal contraceptive: Secondary | ICD-10-CM | POA: Insufficient documentation

## 2015-07-31 DIAGNOSIS — IMO0001 Reserved for inherently not codable concepts without codable children: Secondary | ICD-10-CM

## 2015-07-31 HISTORY — DX: Encounter for initial prescription of implantable subdermal contraceptive: Z30.017

## 2015-07-31 HISTORY — DX: Reserved for inherently not codable concepts without codable children: IMO0001

## 2015-07-31 LAB — POCT URINE PREGNANCY: Preg Test, Ur: NEGATIVE

## 2015-07-31 NOTE — Patient Instructions (Signed)
Use condoms x 4 weeks, keep clean and dry x 24 hours, no heavy lifting, keep steri strips on x 72 hours, Keep pressure dressing on x 24 hours. Follow up in 1 week for recheck  Try to stop smoking  Decrease sugar and salt

## 2015-07-31 NOTE — Progress Notes (Signed)
Subjective:     Patient ID: SKYLEA MEINE, female   DOB: Jun 29, 1971, 44 y.o.   MRN: [id number]  HPI Jamille is 44 year old black female in for nexplanon insertion.  Review of Systems  For nexplanon insertion. Patient denies any headaches, hearing loss, fatigue, blurred vision, shortness of breath, chest pain, abdominal pain, problems with bowel movements, urination, or intercourse. No joint pain or mood swings. Reviewed past medical,surgical, social and family history. Reviewed medications and allergies.     Objective:   Physical Exam BP 140/78 mmHg  Pulse 68  Ht 5\' 8"  (1.727 m)  Wt 121 lb (54.885 kg)  BMI 18.40 kg/m2  LMP 07/29/2015 UPT negative,BP was 160/100 on arrival, Consent signed, time out called. Left arm cleansed with betadine, and injected with 1.5 cc 2% lidocaine and waited til numb. Nexplanon easily inserted and steri strips applied.Rod easily palpated by provider and pt. Pressure dressing applied.    Assessment:     Nexplanon insertion, lot [id number] exp 11/2017 Elevated BP    Plan:     Use condoms x 4 weeks, keep clean and dry x 24 hours, no heavy lifting, keep steri strips on x 72 hours, Keep pressure dressing on x 24 hours. Follow up 1 week,will check site and BP  Decrease salt and sugar and try to quit smoking.

## 2015-08-07 ENCOUNTER — Ambulatory Visit: Payer: Medicaid Other | Admitting: Adult Health

## 2015-08-10 ENCOUNTER — Ambulatory Visit (INDEPENDENT_AMBULATORY_CARE_PROVIDER_SITE_OTHER): Payer: Medicaid Other | Admitting: Adult Health

## 2015-08-10 ENCOUNTER — Encounter: Payer: Self-pay | Admitting: Adult Health

## 2015-08-10 VITALS — BP 120/80 | HR 78 | Ht 68.0 in | Wt 121.5 lb

## 2015-08-10 DIAGNOSIS — Z3049 Encounter for surveillance of other contraceptives: Secondary | ICD-10-CM | POA: Diagnosis not present

## 2015-08-10 NOTE — Progress Notes (Signed)
Subjective:     Patient ID: Sheila Berg, female   DOB: 04-08-1971, 44 y.o.   MRN: [id number]  HPI Sheila Berg is a 44 year old black female, who had nexplanon inserted 11/21 in for recheck of site and BP as BP was elevated at insertion.  Review of Systems Patient denies any headaches, hearing loss, fatigue, blurred vision, shortness of breath, chest pain, abdominal pain, problems with bowel movements, urination, or intercourse. No joint pain or mood swings. Reviewed past medical,surgical, social and family history. Reviewed medications and allergies.     Objective:   Physical Exam BP 120/80 mmHg  Pulse 78  Ht 5\' 8"  (1.727 m)  Wt 121 lb 8 oz (55.112 kg)  BMI 18.48 kg/m2  LMP 07/29/2015 Skin warm and dry. Lungs: clear to ausculation bilaterally. Cardiovascular: regular rate and rhythm.Nexplanon easily palpated in left arm, no redness or tenderness noted.    Assessment:    Contraceptive management    Normal BP   Plan:     Follow up in 6 months for physical

## 2015-08-10 NOTE — Patient Instructions (Signed)
Follow-up in 6 months for physical.

## 2015-10-05 ENCOUNTER — Ambulatory Visit: Payer: Medicaid Other | Admitting: Physician Assistant

## 2015-10-05 ENCOUNTER — Encounter: Payer: Self-pay | Admitting: Physician Assistant

## 2015-10-05 VITALS — BP 104/70 | HR 95 | Temp 98.1°F | Ht 68.0 in | Wt 122.0 lb

## 2015-10-05 DIAGNOSIS — D509 Iron deficiency anemia, unspecified: Secondary | ICD-10-CM

## 2015-10-05 DIAGNOSIS — R928 Other abnormal and inconclusive findings on diagnostic imaging of breast: Secondary | ICD-10-CM

## 2015-10-05 LAB — HEMOGLOBIN: HEMOGLOBIN: 8.5 g/dL — AB (ref 12.0–15.0)

## 2015-10-05 LAB — HEMATOCRIT: HEMATOCRIT: 28.5 % — AB (ref 36.0–46.0)

## 2015-10-05 NOTE — Progress Notes (Signed)
   BP 104/70 mmHg  Pulse 95  Temp(Src) 98.1 F (36.7 C)  Ht 5\' 8"  (1.727 m)  Wt 122 lb (55.339 kg)  BMI 18.55 kg/m2  SpO2 99%   Subjective:    Patient ID: Sheila Berg, female    DOB: 1971-02-18, 45 y.o.   MRN: [id number]  HPI: Sheila Berg is a 45 y.o. female presenting on 10/05/2015 for Anemia   HPI   Pt without complaints.  Feels well today  Relevant past medical, surgical, family and social history reviewed and updated as indicated. Interim medical history since our last visit reviewed. Allergies and medications reviewed and updated.   Current outpatient prescriptions:  .  etonogestrel (NEXPLANON) 53 MG IMPL implant, 1 each by Subdermal route once., Disp: , Rfl:    Review of Systems  Constitutional: Negative for fever, chills, diaphoresis, appetite change, fatigue and unexpected weight change.  HENT: Negative for congestion, dental problem, drooling, ear pain, facial swelling, hearing loss, mouth sores, sneezing, sore throat, trouble swallowing and voice change.   Eyes: Positive for itching and visual disturbance. Negative for pain, discharge and redness.  Respiratory: Negative for cough, choking, shortness of breath and wheezing.   Cardiovascular: Negative for chest pain, palpitations and leg swelling.  Gastrointestinal: Negative for vomiting, abdominal pain, diarrhea, constipation and blood in stool.  Endocrine: Negative for cold intolerance, heat intolerance and polydipsia.  Genitourinary: Negative for dysuria, hematuria and decreased urine volume.  Musculoskeletal: Negative for back pain, arthralgias and gait problem.  Skin: Negative for rash.  Allergic/Immunologic: Negative for environmental allergies.  Neurological: Positive for headaches. Negative for seizures, syncope and light-headedness.  Hematological: Negative for adenopathy.  Psychiatric/Behavioral: Negative for suicidal ideas, dysphoric mood and agitation. The patient is not nervous/anxious.     Per  HPI unless specifically indicated above     Objective:    BP 104/70 mmHg  Pulse 95  Temp(Src) 98.1 F (36.7 C)  Ht 5\' 8"  (1.727 m)  Wt 122 lb (55.339 kg)  BMI 18.55 kg/m2  SpO2 99%  Wt Readings from Last 3 Encounters:  10/05/15 122 lb (55.339 kg)  08/10/15 121 lb 8 oz (55.112 kg)  07/31/15 121 lb (54.885 kg)    Physical Exam  Constitutional: She is oriented to person, place, and time. She appears well-developed and well-nourished.  HENT:  Head: Normocephalic and atraumatic.  Neck: Neck supple.  Cardiovascular: Normal rate and regular rhythm.   Pulmonary/Chest: Effort normal and breath sounds normal.  Abdominal: Soft. Bowel sounds are normal. She exhibits no mass. There is no hepatosplenomegaly. There is no tenderness.  Musculoskeletal: She exhibits no edema.  Lymphadenopathy:    She has no cervical adenopathy.  Neurological: She is alert and oriented to person, place, and time.  Skin: Skin is warm and dry.  Psychiatric: She has a normal mood and affect. Her behavior is normal.  Vitals reviewed.       Assessment & Plan:   Encounter Diagnoses  Name Primary?  . Microcytic anemia Yes  . Abnormal mammogram     -Schedule f/u diagnostic mammo- (pt has family planning medicaid) -F/u 72months.  RTO sooner prn

## 2015-10-05 NOTE — Patient Instructions (Signed)
Smoking Cessation, Tips for Success If you are ready to quit smoking, congratulations! You have chosen to help yourself be healthier. Cigarettes bring nicotine, tar, carbon monoxide, and other irritants into your body. Your lungs, heart, and blood vessels will be able to work better without these poisons. There are many different ways to quit smoking. Nicotine gum, nicotine patches, a nicotine inhaler, or nicotine nasal spray can help with physical craving. Hypnosis, support groups, and medicines help break the habit of smoking. WHAT THINGS CAN I DO TO MAKE QUITTING EASIER?  Here are some tips to help you quit for good:  Pick a date when you will quit smoking completely. Tell all of your friends and family about your plan to quit on that date.  Do not try to slowly cut down on the number of cigarettes you are smoking. Pick a quit date and quit smoking completely starting on that day.  Throw away all cigarettes.   Clean and remove all ashtrays from your home, work, and car.  On a card, write down your reasons for quitting. Carry the card with you and read it when you get the urge to smoke.  Cleanse your body of nicotine. Drink enough water and fluids to keep your urine clear or pale yellow. Do this after quitting to flush the nicotine from your body.  Learn to predict your moods. Do not let a bad situation be your excuse to have a cigarette. Some situations in your life might tempt you into wanting a cigarette.  Never have "just one" cigarette. It leads to wanting another and another. Remind yourself of your decision to quit.  Change habits associated with smoking. If you smoked while driving or when feeling stressed, try other activities to replace smoking. Stand up when drinking your coffee. Brush your teeth after eating. Sit in a different chair when you read the paper. Avoid alcohol while trying to quit, and try to drink fewer caffeinated beverages. Alcohol and caffeine may urge you to  smoke.  Avoid foods and drinks that can trigger a desire to smoke, such as sugary or spicy foods and alcohol.  Ask people who smoke not to smoke around you.  Have something planned to do right after eating or having a cup of coffee. For example, plan to take a walk or exercise.  Try a relaxation exercise to calm you down and decrease your stress. Remember, you may be tense and nervous for the first 2 weeks after you quit, but this will pass.  Find new activities to keep your hands busy. Play with a pen, coin, or rubber band. Doodle or draw things on paper.  Brush your teeth right after eating. This will help cut down on the craving for the taste of tobacco after meals. You can also try mouthwash.   Use oral substitutes in place of cigarettes. Try using lemon drops, carrots, cinnamon sticks, or chewing gum. Keep them handy so they are available when you have the urge to smoke.  When you have the urge to smoke, try deep breathing.  Designate your home as a nonsmoking area.  If you are a heavy smoker, ask your health care provider about a prescription for nicotine chewing gum. It can ease your withdrawal from nicotine.  Reward yourself. Set aside the cigarette money you save and buy yourself something nice.  Look for support from others. Join a support group or smoking cessation program. Ask someone at home or at work to help you with your plan   to quit smoking.  Always ask yourself, "Do I need this cigarette or is this just a reflex?" Tell yourself, "Today, I choose not to smoke," or "I do not want to smoke." You are reminding yourself of your decision to quit.  Do not replace cigarette smoking with electronic cigarettes (commonly called e-cigarettes). The safety of e-cigarettes is unknown, and some may contain harmful chemicals.  If you relapse, do not give up! Plan ahead and think about what you will do the next time you get the urge to smoke. HOW WILL I FEEL WHEN I QUIT SMOKING? You  may have symptoms of withdrawal because your body is used to nicotine (the addictive substance in cigarettes). You may crave cigarettes, be irritable, feel very hungry, cough often, get headaches, or have difficulty concentrating. The withdrawal symptoms are only temporary. They are strongest when you first quit but will go away within 10-14 days. When withdrawal symptoms occur, stay in control. Think about your reasons for quitting. Remind yourself that these are signs that your body is healing and getting used to being without cigarettes. Remember that withdrawal symptoms are easier to treat than the major diseases that smoking can cause.  Even after the withdrawal is over, expect periodic urges to smoke. However, these cravings are generally short lived and will go away whether you smoke or not. Do not smoke! WHAT RESOURCES ARE AVAILABLE TO HELP ME QUIT SMOKING? Your health care provider can direct you to community resources or hospitals for support, which may include:  Group support.  Education.  Hypnosis.  Therapy.   This information is not intended to replace advice given to you by your health care provider. Make sure you discuss any questions you have with your health care provider.   Document Released: 05/24/2004 Document Revised: 09/16/2014 Document Reviewed: 02/11/2013 [employer] Interactive Patient Education 2016 [employer]  

## 2015-11-20 ENCOUNTER — Ambulatory Visit: Payer: Self-pay | Admitting: Physician Assistant

## 2015-11-29 ENCOUNTER — Ambulatory Visit: Payer: Self-pay | Admitting: Physician Assistant

## 2015-11-29 ENCOUNTER — Encounter: Payer: Self-pay | Admitting: Physician Assistant

## 2015-11-29 VITALS — BP 108/82 | HR 82 | Temp 98.1°F | Ht 68.0 in | Wt 123.0 lb

## 2015-11-29 DIAGNOSIS — D509 Iron deficiency anemia, unspecified: Secondary | ICD-10-CM

## 2015-11-29 DIAGNOSIS — K649 Unspecified hemorrhoids: Secondary | ICD-10-CM

## 2015-11-29 MED ORDER — HYDROCORTISONE ACETATE 25 MG RE SUPP: 25.0000 mg | Freq: Two times a day (BID) | RECTAL | Status: DC | PRN

## 2015-11-29 NOTE — Patient Instructions (Signed)

## 2015-11-29 NOTE — Progress Notes (Signed)
BP 108/82 mmHg  Pulse 82  Temp(Src) 98.1 F (36.7 C)  Ht 5\' 8"  (1.727 m)  Wt 123 lb (55.792 kg)  BMI 18.71 kg/m2  SpO2 99%   Subjective:    Patient ID: Sheila Berg, female    DOB: 31-Dec-1970, 45 y.o.   MRN: [id number]  HPI: Sheila Berg is a 45 y.o. female presenting on 11/29/2015 for Anemia and Hemorrhoids   HPI   Chief Complaint  Patient presents with  . Anemia  . Hemorrhoids    bleeds almost every time has BM, moderate amount, not always associated with BM     Pt states sometimes hemorrhoid bleeding is "like I'm peeing" when asked about quantitiy of blood.   Pt had colonoscopy aug 2016 which showed large hemorrhoids. She was told to use miralax and benefiber.  Says they made her "stomach boil" so she doesn't use them.  Relevant past medical, surgical, family and social history reviewed and updated as indicated. Interim medical history since our last visit reviewed. Allergies and medications reviewed and updated.  CURRENT MEDS: nexplanon Ferrous sulfate daily  Review of Systems  Constitutional: Positive for chills, diaphoresis and fatigue. Negative for fever, appetite change and unexpected weight change.  HENT: Negative for congestion, dental problem, drooling, ear pain, facial swelling, hearing loss, mouth sores, sneezing, sore throat, trouble swallowing and voice change.   Eyes: Negative for pain, discharge, redness, itching and visual disturbance.  Respiratory: Negative for cough, choking, shortness of breath and wheezing.   Cardiovascular: Negative for chest pain, palpitations and leg swelling.  Gastrointestinal: Positive for blood in stool. Negative for vomiting, abdominal pain, diarrhea and constipation.  Endocrine: Positive for cold intolerance. Negative for heat intolerance and polydipsia.  Genitourinary: Negative for dysuria, hematuria and decreased urine volume.  Musculoskeletal: Negative for back pain, arthralgias and gait problem.  Skin: Negative  for rash.  Allergic/Immunologic: Negative for environmental allergies.  Neurological: Positive for light-headedness. Negative for seizures, syncope and headaches.  Hematological: Negative for adenopathy.  Psychiatric/Behavioral: Negative for suicidal ideas, dysphoric mood and agitation. The patient is not nervous/anxious.     Per HPI unless specifically indicated above     Objective:    BP 108/82 mmHg  Pulse 82  Temp(Src) 98.1 F (36.7 C)  Ht 5\' 8"  (1.727 m)  Wt 123 lb (55.792 kg)  BMI 18.71 kg/m2  SpO2 99%  Wt Readings from Last 3 Encounters:  11/29/15 123 lb (55.792 kg)  10/05/15 122 lb (55.339 kg)  08/10/15 121 lb 8 oz (55.112 kg)    Physical Exam  Constitutional: She is oriented to person, place, and time. She appears well-developed and well-nourished.  HENT:  Head: Normocephalic and atraumatic.  Neck: Neck supple.  Cardiovascular: Normal rate and regular rhythm.   Pulmonary/Chest: Effort normal and breath sounds normal.  Abdominal: Soft. Bowel sounds are normal. She exhibits no mass. There is no hepatosplenomegaly. There is no tenderness.  Genitourinary: Rectal exam shows external hemorrhoid. Rectal exam shows no tenderness.  No thrombosed hemorrhoids (nurse Berenice assisted)  Musculoskeletal: She exhibits no edema.  Lymphadenopathy:    She has no cervical adenopathy.  Neurological: She is alert and oriented to person, place, and time.  Skin: Skin is warm and dry.  Psychiatric: She has a normal mood and affect. Her behavior is normal.  Vitals reviewed.   Results for orders placed or performed in visit on 10/05/15  Hemoglobin  Result Value Ref Range   Hemoglobin 8.5 (L) 12.0 - 15.0 g/dL  Hematocrit  Result Value Ref Range   HCT 28.5 (L) 36.0 - 46.0 %      Assessment & Plan:   Encounter Diagnoses  Name Primary?  . Microcytic anemia Yes  . Hemorrhoids, unspecified hemorrhoid type     -check h/h -pt given anusol-hc suppositories and counseled to use  fiber -she is given handout on hemorrhoids -if anemia worsens, may need to send to surgeon/GI for treatment of hemorrhoids -f/u 3 months.  RTO sooner prn

## 2015-11-30 LAB — HEMATOCRIT: HEMATOCRIT: 37.6 % (ref 36.0–46.0)

## 2015-11-30 LAB — HEMOGLOBIN: HEMOGLOBIN: 11.5 g/dL — AB (ref 12.0–15.0)

## 2015-12-01 ENCOUNTER — Other Ambulatory Visit: Payer: Self-pay | Admitting: Physician Assistant

## 2015-12-01 DIAGNOSIS — D649 Anemia, unspecified: Secondary | ICD-10-CM

## 2015-12-01 DIAGNOSIS — K649 Unspecified hemorrhoids: Secondary | ICD-10-CM | POA: Insufficient documentation

## 2016-01-29 ENCOUNTER — Other Ambulatory Visit: Payer: Self-pay | Admitting: Physician Assistant

## 2016-01-29 DIAGNOSIS — Z1239 Encounter for other screening for malignant neoplasm of breast: Secondary | ICD-10-CM

## 2016-02-08 ENCOUNTER — Encounter: Payer: Self-pay | Admitting: Adult Health

## 2016-02-08 ENCOUNTER — Other Ambulatory Visit (HOSPITAL_COMMUNITY)
Admission: RE | Admit: 2016-02-08 | Discharge: 2016-02-08 | Disposition: A | Discharge status: Home / Self Care | Payer: Medicaid Other | Source: Ambulatory Visit | Attending: Adult Health | Admitting: Adult Health

## 2016-02-08 ENCOUNTER — Ambulatory Visit (INDEPENDENT_AMBULATORY_CARE_PROVIDER_SITE_OTHER): Payer: Medicaid Other | Admitting: Adult Health

## 2016-02-08 VITALS — BP 122/70 | HR 77 | Ht 67.0 in | Wt 123.0 lb

## 2016-02-08 DIAGNOSIS — Z01419 Encounter for gynecological examination (general) (routine) without abnormal findings: Secondary | ICD-10-CM

## 2016-02-08 DIAGNOSIS — Z3049 Encounter for surveillance of other contraceptives: Secondary | ICD-10-CM | POA: Diagnosis not present

## 2016-02-08 DIAGNOSIS — Z975 Presence of (intrauterine) contraceptive device: Secondary | ICD-10-CM

## 2016-02-08 DIAGNOSIS — Z3040 Encounter for surveillance of contraceptives, unspecified: Secondary | ICD-10-CM

## 2016-02-08 DIAGNOSIS — L0292 Furuncle, unspecified: Secondary | ICD-10-CM

## 2016-02-08 DIAGNOSIS — N921 Excessive and frequent menstruation with irregular cycle: Secondary | ICD-10-CM

## 2016-02-08 DIAGNOSIS — Z3009 Encounter for other general counseling and advice on contraception: Secondary | ICD-10-CM

## 2016-02-08 DIAGNOSIS — Z1151 Encounter for screening for human papillomavirus (HPV): Secondary | ICD-10-CM | POA: Insufficient documentation

## 2016-02-08 DIAGNOSIS — Z113 Encounter for screening for infections with a predominantly sexual mode of transmission: Secondary | ICD-10-CM | POA: Insufficient documentation

## 2016-02-08 HISTORY — DX: Excessive and frequent menstruation with irregular cycle: N92.1

## 2016-02-08 HISTORY — DX: Presence of (intrauterine) contraceptive device: Z97.5

## 2016-02-08 HISTORY — DX: Furuncle, unspecified: L02.92

## 2016-02-08 LAB — HEMOCCULT GUIAC POC 1CARD (OFFICE): FECAL OCCULT BLD: NEGATIVE

## 2016-02-08 MED ORDER — SULFAMETHOXAZOLE-TRIMETHOPRIM 800-160 MG PO TABS: 1.0000 | ORAL_TABLET | Freq: Two times a day (BID) | ORAL | Status: DC

## 2016-02-08 MED ORDER — MEGESTROL ACETATE 40 MG PO TABS: ORAL_TABLET | ORAL | Status: DC

## 2016-02-08 NOTE — Progress Notes (Signed)
Patient ID: Sheila Berg, female   DOB: 11/30/1970, 45 y.o.   MRN: [id number] History of Present Illness:  Sheila Berg is a 45 year old black female in for a well woman gyn exam and pap, she has family planning medicaid.She has nexplanon in and has had irregular bleeding.She also complains of boil on buttock and labia.She says the boils come and go.  Current Medications, Allergies, Past Medical History, Past Surgical History, Family History and Social History were reviewed in [employer] record.     Review of Systems:  Patient denies any headaches, hearing loss, fatigue, blurred vision, shortness of breath, chest pain, abdominal pain, problems with bowel movements, urination, or intercourse. No joint pain or mood swings.See HPI for positives.   Physical Exam:BP 122/70 mmHg  Pulse 77  Ht 5\' 7"  (1.702 m)  Wt 123 lb (55.792 kg)  BMI 19.26 kg/m2  LMP  General:  Well developed, well nourished, no acute distress Skin:  Warm and dry Neck:  Midline trachea, normal thyroid, good ROM, no lymphadenopathy Lungs; Clear to auscultation bilaterally Breast:  No dominant palpable mass, retraction, or nipple discharge Cardiovascular: Regular rate and rhythm Abdomen:  Soft, non tender, no hepatosplenomegaly Pelvic:  External genitalia is normal in appearance, but has boil under the skin right labia.  The vagina is normal in appearance,has period like blood. Urethra has no lesions or masses. The cervix is bulbous, pap with HPV and GC/CHL performed.  Uterus is felt to be normal size, shape, and contour.  No adnexal masses or tenderness noted.Bladder is non tender, no masses felt.Has boil under skin left buttock Rectal: Good sphincter tone, no polyps, or hemorrhoids felt.  Hemoccult negative. Extremities/musculoskeletal:  No swelling or varicosities noted, no clubbing or cyanosis Psych:  No mood changes, alert and cooperative,seems happy   Impression: Well woman gyn exam and pap Family  planning Contraceptive surveillance of implant Irregular intermenstrual bleeding Nexplanon in place Boil      Plan: Check HIV,RPR and HSV 2 Rx megace 40 mg #45 3 x 5 days then 2 x 5 days then 1 daily with 1 refill Rx septra ds #28 take 1 bid x 14 days with 1 refill Use dial soap, clip don't shave Physical in 1 year Mammogram yearly

## 2016-02-08 NOTE — Patient Instructions (Signed)
Use dial soap Physical in 1 year Mammogram yearly Abscess An abscess is an infected area that contains a collection of pus and debris.It can occur in almost any part of the body. An abscess is also known as a furuncle or boil. CAUSES  An abscess occurs when tissue gets infected. This can occur from blockage of oil or sweat glands, infection of hair follicles, or a minor injury to the skin. As the body tries to fight the infection, pus collects in the area and creates pressure under the skin. This pressure causes pain. People with weakened immune systems have difficulty fighting infections and get certain abscesses more often.  SYMPTOMS Usually an abscess develops on the skin and becomes a painful mass that is red, warm, and tender. If the abscess forms under the skin, you may feel a moveable soft area under the skin. Some abscesses break open (rupture) on their own, but most will continue to get worse without care. The infection can spread deeper into the body and eventually into the bloodstream, causing you to feel ill.  DIAGNOSIS  Your caregiver will take your medical history and perform a physical exam. A sample of fluid may also be taken from the abscess to determine what is causing your infection. TREATMENT  Your caregiver may prescribe antibiotic medicines to fight the infection. However, taking antibiotics alone usually does not cure an abscess. Your caregiver may need to make a small cut (incision) in the abscess to drain the pus. In some cases, gauze is packed into the abscess to reduce pain and to continue draining the area. HOME CARE INSTRUCTIONS   Only take over-the-counter or prescription medicines for pain, discomfort, or fever as directed by your caregiver.  If you were prescribed antibiotics, take them as directed. Finish them even if you start to feel better.  If gauze is used, follow your caregiver's directions for changing the gauze.  To avoid spreading the infection:  Keep  your draining abscess covered with a bandage.  Wash your hands well.  Do not share personal care items, towels, or whirlpools with others.  Avoid skin contact with others.  Keep your skin and clothes clean around the abscess.  Keep all follow-up appointments as directed by your caregiver. SEEK MEDICAL CARE IF:   You have increased pain, swelling, redness, fluid drainage, or bleeding.  You have muscle aches, chills, or a general ill feeling.  You have a fever. MAKE SURE YOU:   Understand these instructions.  Will watch your condition.  Will get help right away if you are not doing well or get worse.   This information is not intended to replace advice given to you by your health care provider. Make sure you discuss any questions you have with your health care provider.   Document Released: 06/05/2005 Document Revised: 02/25/2012 Document Reviewed: 11/08/2011 Elsevier Interactive Patient Education [employer].

## 2016-02-09 ENCOUNTER — Telehealth: Payer: Self-pay | Admitting: Adult Health

## 2016-02-09 ENCOUNTER — Encounter: Payer: Self-pay | Admitting: Adult Health

## 2016-02-09 DIAGNOSIS — R894 Abnormal immunological findings in specimens from other organs, systems and tissues: Secondary | ICD-10-CM

## 2016-02-09 HISTORY — DX: Abnormal immunological findings in specimens from other organs, systems and tissues: R89.4

## 2016-02-09 LAB — CYTOLOGY - PAP

## 2016-02-09 LAB — HSV 2 ANTIBODY, IGG: HSV 2 GLYCOPROTEIN G AB, IGG: 2.24 {index} — AB (ref 0.00–0.90)

## 2016-02-09 LAB — HIV ANTIBODY (ROUTINE TESTING W REFLEX): HIV SCREEN 4TH GENERATION: NONREACTIVE

## 2016-02-09 LAB — RPR: RPR Ser Ql: NONREACTIVE

## 2016-02-09 NOTE — Telephone Encounter (Signed)
Pt aware of labs, +HSV 2 antibodies, use condoms

## 2016-02-12 ENCOUNTER — Encounter: Payer: Self-pay | Admitting: Adult Health

## 2016-02-12 ENCOUNTER — Telehealth: Payer: Self-pay | Admitting: Adult Health

## 2016-02-12 DIAGNOSIS — A599 Trichomoniasis, unspecified: Secondary | ICD-10-CM

## 2016-02-12 HISTORY — DX: Trichomoniasis, unspecified: A59.9

## 2016-02-12 MED ORDER — METRONIDAZOLE 500 MG PO TABS: ORAL_TABLET | ORAL | Status: DC

## 2016-02-12 NOTE — Telephone Encounter (Signed)
Pt aware pap +trich will rx flagyl 500 mg #4, 4 po now and will treat partner Floreen Comber DOB [birth year]20[birth year] and will do POC in 10 days, no sex or alcohol

## 2016-02-20 ENCOUNTER — Other Ambulatory Visit: Payer: Self-pay | Admitting: Student

## 2016-02-20 DIAGNOSIS — D649 Anemia, unspecified: Secondary | ICD-10-CM

## 2016-02-22 ENCOUNTER — Ambulatory Visit (INDEPENDENT_AMBULATORY_CARE_PROVIDER_SITE_OTHER): Payer: Medicaid Other | Admitting: Adult Health

## 2016-02-22 ENCOUNTER — Encounter: Payer: Self-pay | Admitting: Adult Health

## 2016-02-22 VITALS — BP 130/72 | HR 78 | Ht 67.0 in | Wt 122.0 lb

## 2016-02-22 DIAGNOSIS — Z308 Encounter for other contraceptive management: Secondary | ICD-10-CM

## 2016-02-22 DIAGNOSIS — Z3040 Encounter for surveillance of contraceptives, unspecified: Secondary | ICD-10-CM

## 2016-02-22 DIAGNOSIS — A599 Trichomoniasis, unspecified: Secondary | ICD-10-CM

## 2016-02-22 LAB — POCT WET PREP (WET MOUNT)

## 2016-02-22 NOTE — Patient Instructions (Signed)
Follow up prn

## 2016-02-22 NOTE — Progress Notes (Signed)
Subjective:     Patient ID: Sheila Berg, female   DOB: [birth year], 45 y.o.   MRN: [id number]  HPI Sheila Berg is a 45 year old black female, with nexplanon, she had BTB and had trich on her pap. The bleeding has stopped with megace and she took the meds for trich. She is ahppy with her nexplanon now that bleeding has stopped.   Review of Systems Patient denies any headaches, hearing loss, fatigue, blurred vision, shortness of breath, chest pain, abdominal pain, problems with bowel movements, urination, or intercourse. No joint pain or mood swings. Reviewed past medical,surgical, social and family history. Reviewed medications and allergies.     Objective:   Physical Exam BP 130/72 mmHg  Pulse 78  Ht 5\' 7"  (1.702 m)  Wt 122 lb (55.339 kg)  BMI 19.10 kg/m2  LMP  Skin warm and dry.Pelvic: external genitalia is normal in appearance no lesions, vagina: scant discharge without odor,urethra has no lesions or masses noted, cervix:smooth and bulbous, uterus: normal size, shape and contour, non tender, no masses felt, adnexa: no masses or tenderness noted. Bladder is non tender and no masses felt. Wet prep: negative    Assessment:     Contraceptive surveillance of nexplanon Trich    Plan:     Follow up prn

## 2016-02-28 LAB — HEMATOCRIT: HCT: 37.7 % (ref 35.0–45.0)

## 2016-02-28 LAB — HEMOGLOBIN: Hemoglobin: 11.9 g/dL (ref 11.7–15.5)

## 2016-02-29 ENCOUNTER — Ambulatory Visit: Payer: Self-pay | Admitting: Physician Assistant

## 2016-02-29 ENCOUNTER — Encounter: Payer: Self-pay | Admitting: Physician Assistant

## 2016-02-29 VITALS — BP 116/80 | HR 72 | Temp 97.9°F | Ht 67.0 in | Wt 122.0 lb

## 2016-02-29 DIAGNOSIS — F17210 Nicotine dependence, cigarettes, uncomplicated: Secondary | ICD-10-CM

## 2016-02-29 DIAGNOSIS — D649 Anemia, unspecified: Secondary | ICD-10-CM

## 2016-02-29 DIAGNOSIS — Z13220 Encounter for screening for lipoid disorders: Secondary | ICD-10-CM

## 2016-02-29 NOTE — Patient Instructions (Signed)

## 2016-02-29 NOTE — Progress Notes (Signed)
   BP 116/80 mmHg  Pulse 72  Temp(Src) 97.9 F (36.6 C)  Ht 5\' 7"  (1.702 m)  Wt 122 lb (55.339 kg)  BMI 19.10 kg/m2  SpO2 99%   Subjective:    Patient ID: Sheila Berg, female    DOB: [birth year], 45 y.o.   MRN: [id number]  HPI: Sheila Berg is a 45 y.o. female presenting on 02/29/2016 for Anemia   HPI   Pt feels well.  Relevant past medical, surgical, family and social history reviewed and updated as indicated. Interim medical history since our last visit reviewed. Allergies and medications reviewed and updated.   Current outpatient prescriptions:  .  etonogestrel (NEXPLANON) 68 MG IMPL implant, 1 each by Subdermal route once., Disp: , Rfl:  .  ferrous sulfate 325 (65 FE) MG tablet, Take 325 mg by mouth 2 (two) times daily., Disp: , Rfl:  .  megestrol (MEGACE) 40 MG tablet, Take 3 a day for 5 days then 2 daily for 5 days then 1 daily, Disp: 45 tablet, Rfl: 1   Review of Systems  Constitutional: Positive for fatigue. Negative for fever, chills, appetite change and unexpected weight change.  HENT: Negative for congestion, dental problem, drooling, ear pain, facial swelling, hearing loss, mouth sores, sneezing, sore throat, trouble swallowing and voice change.   Eyes: Negative for pain, discharge, redness, itching and visual disturbance.  Respiratory: Negative for cough, choking, shortness of breath and wheezing.   Cardiovascular: Negative for chest pain, palpitations and leg swelling.  Gastrointestinal: Negative for vomiting, abdominal pain, diarrhea, constipation and blood in stool.  Endocrine: Positive for cold intolerance. Negative for heat intolerance and polydipsia.  Genitourinary: Negative for dysuria, hematuria and decreased urine volume.  Musculoskeletal: Negative for back pain, arthralgias and gait problem.  Skin: Negative for rash.  Allergic/Immunologic: Negative for environmental allergies.  Neurological: Positive for headaches. Negative for seizures, syncope and  light-headedness.  Hematological: Negative for adenopathy.  Psychiatric/Behavioral: Negative for suicidal ideas, dysphoric mood and agitation. The patient is not nervous/anxious.     Per HPI unless specifically indicated above     Objective:    BP 116/80 mmHg  Pulse 72  Temp(Src) 97.9 F (36.6 C)  Ht 5\' 7"  (1.702 m)  Wt 122 lb (55.339 kg)  BMI 19.10 kg/m2  SpO2 99%  Wt Readings from Last 3 Encounters:  02/29/16 122 lb (55.339 kg)  02/22/16 122 lb (55.339 kg)  02/08/16 123 lb (55.792 kg)    Physical Exam  Constitutional: She is oriented to person, place, and time. She appears well-developed and well-nourished.  HENT:  Head: Normocephalic and atraumatic.  Neck: Neck supple.  Cardiovascular: Normal rate and regular rhythm.   Pulmonary/Chest: Effort normal and breath sounds normal.  Abdominal: Soft. Bowel sounds are normal. She exhibits no mass. There is no hepatosplenomegaly. There is no tenderness.  Musculoskeletal: She exhibits no edema.  Lymphadenopathy:    She has no cervical adenopathy.  Neurological: She is alert and oriented to person, place, and time.  Skin: Skin is warm and dry.  Psychiatric: She has a normal mood and affect. Her behavior is normal.  Vitals reviewed.       Assessment & Plan:   Encounter Diagnoses  Name Primary?  Marland Kitchen Anemia, unspecified anemia type Yes  . Cigarette nicotine dependence without complication     -Reviewed h/h labs with pt- improved -Continue iron -Counseled smoking cessation -F/u 4 months. RTO sooner prn

## 2016-04-04 ENCOUNTER — Ambulatory Visit: Payer: Self-pay | Admitting: Physician Assistant

## 2016-06-18 ENCOUNTER — Other Ambulatory Visit: Payer: Self-pay

## 2016-06-18 DIAGNOSIS — Z13220 Encounter for screening for lipoid disorders: Secondary | ICD-10-CM

## 2016-06-18 DIAGNOSIS — D649 Anemia, unspecified: Secondary | ICD-10-CM

## 2016-06-26 LAB — LIPID PANEL
CHOLESTEROL: 129 mg/dL (ref 125–200)
HDL: 44 mg/dL — ABNORMAL LOW (ref >=46)
LDL Cholesterol: 77 mg/dL (ref <130)
TRIGLYCERIDES: 39 mg/dL (ref <150)
Total CHOL/HDL Ratio: 2.9 Ratio (ref <=5.0)
VLDL: 8 mg/dL (ref <30)

## 2016-06-26 LAB — COMPLETE METABOLIC PANEL WITH GFR
ALBUMIN: 3.4 g/dL — AB (ref 3.6–5.1)
ALK PHOS: 62 U/L (ref 33–115)
ALT: 9 U/L (ref 6–29)
AST: 13 U/L (ref 10–35)
BILIRUBIN TOTAL: 0.3 mg/dL (ref 0.2–1.2)
BUN: 11 mg/dL (ref 7–25)
CALCIUM: 8.9 mg/dL (ref 8.6–10.2)
CHLORIDE: 110 mmol/L (ref 98–110)
CO2: 22 mmol/L (ref 20–31)
CREATININE: 0.77 mg/dL (ref 0.50–1.10)
GFR, Est Non African American: >89 mL/min (ref >=60)
Glucose, Bld: 84 mg/dL (ref 65–99)
Potassium: 4.1 mmol/L (ref 3.5–5.3)
Sodium: 140 mmol/L (ref 135–146)
TOTAL PROTEIN: 6.3 g/dL (ref 6.1–8.1)

## 2016-06-26 LAB — HEMOGLOBIN: HEMOGLOBIN: 11.5 g/dL — AB (ref 11.7–15.5)

## 2016-06-26 LAB — HEMATOCRIT: HEMATOCRIT: 37.5 % (ref 35.0–45.0)

## 2016-06-27 ENCOUNTER — Encounter: Payer: Self-pay | Admitting: Physician Assistant

## 2016-06-27 ENCOUNTER — Ambulatory Visit: Payer: Medicaid Other | Admitting: Physician Assistant

## 2016-06-27 VITALS — BP 102/70 | HR 101 | Temp 98.1°F | Ht 67.0 in | Wt 125.2 lb

## 2016-06-27 DIAGNOSIS — F17210 Nicotine dependence, cigarettes, uncomplicated: Secondary | ICD-10-CM

## 2016-06-27 DIAGNOSIS — D649 Anemia, unspecified: Secondary | ICD-10-CM

## 2016-06-27 DIAGNOSIS — N921 Excessive and frequent menstruation with irregular cycle: Secondary | ICD-10-CM

## 2016-06-27 DIAGNOSIS — Z1239 Encounter for other screening for malignant neoplasm of breast: Secondary | ICD-10-CM

## 2016-06-27 DIAGNOSIS — K649 Unspecified hemorrhoids: Secondary | ICD-10-CM

## 2016-06-27 NOTE — Progress Notes (Signed)
BP 102/70 (BP Location: Right Arm, Patient Position: Sitting, Cuff Size: Normal)   Pulse (!) 101   Temp 98.1 F (36.7 C)   Ht 5\' 7"  (1.702 m)   Wt 125 lb 3.2 oz (56.8 kg)   SpO2 98%   BMI 19.61 kg/m    Subjective:    Patient ID: Sheila Berg, female    DOB: 05-26-1971, 45 y.o.   MRN: [id number]  HPI: Sheila Berg is a 45 y.o. female presenting on 06/27/2016 for Anemia   HPI   Pt had stopped menstrual bleeding when she was on the megace but says the bleeding returned when she finished the megace.  She says her family planning medicaid ran out.   When asked how many days her cycle lasts, Pt states her menses are very heavy and her bleeding never stops.  She says it gets lighter but it never stops completely.  Pt states her hemorrhoids never went away despite using the suppositories that she was prescribed and adding fiber.  She says she didn't notice any improvement at all from the hemorrhoids.  She says that they bleed a little bit at times, but not really too noticeable since she has constant menstrual bleeding.     Relevant past medical, surgical, family and social history reviewed and updated as indicated. Interim medical history since our last visit reviewed. Allergies and medications reviewed and updated.   Current Outpatient Prescriptions:  .  etonogestrel (NEXPLANON) 68 MG IMPL implant, 1 each by Subdermal route once., Disp: , Rfl:  .  ferrous sulfate 325 (65 FE) MG tablet, Take 325 mg by mouth 2 (two) times daily., Disp: , Rfl:    Review of Systems  Constitutional: Positive for chills, diaphoresis and fatigue. Negative for appetite change, fever and unexpected weight change.  HENT: Negative for congestion, dental problem, drooling, ear pain, facial swelling, hearing loss, mouth sores, sneezing, sore throat, trouble swallowing and voice change.   Eyes: Negative for pain, discharge, redness, itching and visual disturbance.  Respiratory: Negative for cough, choking,  shortness of breath and wheezing.   Cardiovascular: Negative for chest pain, palpitations and leg swelling.  Gastrointestinal: Negative for abdominal pain, blood in stool, constipation, diarrhea and vomiting.  Endocrine: Positive for cold intolerance. Negative for heat intolerance and polydipsia.  Genitourinary: Negative for decreased urine volume, dysuria and hematuria.  Musculoskeletal: Negative for arthralgias, back pain and gait problem.  Skin: Negative for rash.  Allergic/Immunologic: Negative for environmental allergies.  Neurological: Negative for seizures, syncope, light-headedness and headaches.  Hematological: Negative for adenopathy.  Psychiatric/Behavioral: Negative for agitation, dysphoric mood and suicidal ideas. The patient is not nervous/anxious.     Per HPI unless specifically indicated above     Objective:    BP 102/70 (BP Location: Right Arm, Patient Position: Sitting, Cuff Size: Normal)   Pulse (!) 101   Temp 98.1 F (36.7 C)   Ht 5\' 7"  (1.702 m)   Wt 125 lb 3.2 oz (56.8 kg)   SpO2 98%   BMI 19.61 kg/m   Wt Readings from Last 3 Encounters:  06/27/16 125 lb 3.2 oz (56.8 kg)  02/29/16 122 lb (55.3 kg)  02/22/16 122 lb (55.3 kg)    Physical Exam  Constitutional: She is oriented to person, place, and time. She appears well-developed and well-nourished.  HENT:  Head: Normocephalic and atraumatic.  Neck: Neck supple.  Cardiovascular: Normal rate and regular rhythm.   Pulmonary/Chest: Effort normal and breath sounds normal.  Abdominal: Soft. Bowel  sounds are normal. She exhibits no mass. There is no hepatosplenomegaly. There is no tenderness.  Genitourinary: Rectal exam shows external hemorrhoid.  Musculoskeletal: She exhibits no edema.  Lymphadenopathy:    She has no cervical adenopathy.  Neurological: She is alert and oriented to person, place, and time.  Skin: Skin is warm and dry.  Psychiatric: She has a normal mood and affect. Her behavior is normal.   Vitals reviewed.   Results for orders placed or performed in visit on 06/18/16  Hematocrit  Result Value Ref Range   HCT 37.5 35.0 - 45.0 %  Hemoglobin  Result Value Ref Range   Hemoglobin 11.5 (L) 11.7 - 15.5 g/dL  Lipid Profile  Result Value Ref Range   Cholesterol 129 125 - 200 mg/dL   Triglycerides 39 <150 mg/dL   HDL 44 (L) >=46 mg/dL   Total CHOL/HDL Ratio 2.9 <=5.0 Ratio   VLDL 8 <30 mg/dL   LDL Cholesterol 77 <130 mg/dL  COMPLETE METABOLIC PANEL WITH GFR  Result Value Ref Range   Sodium 140 135 - 146 mmol/L   Potassium 4.1 3.5 - 5.3 mmol/L   Chloride 110 98 - 110 mmol/L   CO2 22 20 - 31 mmol/L   Glucose, Bld 84 65 - 99 mg/dL   BUN 11 7 - 25 mg/dL   Creat 0.77 0.50 - 1.10 mg/dL   Total Bilirubin 0.3 0.2 - 1.2 mg/dL   Alkaline Phosphatase 62 33 - 115 U/L   AST 13 10 - 35 U/L   ALT 9 6 - 29 U/L   Total Protein 6.3 6.1 - 8.1 g/dL   Albumin 3.4 (L) 3.6 - 5.1 g/dL   Calcium 8.9 8.6 - 10.2 mg/dL   GFR, Est African American >89 >=60 mL/min   GFR, Est Non African American >89 >=60 mL/min      Assessment & Plan:    Encounter Diagnoses  Name Primary?  Marland Kitchen Anemia, unspecified type Yes  . Hemorrhoids, unspecified hemorrhoid type   . Menorrhagia with irregular cycle   . Screening for breast cancer   . Cigarette nicotine dependence without complication     -reviewed labs with pt -Gave pt [facility] discount application  -will refer back to gyn for persistent abnormal menstrual bleeding -Refer to GI for hemorrhoids.  Pt not wanting surgery for treatment so GI should be able to help improve the hemorrhoids. Discussed rx for more suppositories but pt doesn't want to now because she said she had no improvement from them last time -Screening mammogram -pt to Continue iron -F/u 3 months. RTO sooner prn

## 2016-06-27 NOTE — Patient Instructions (Signed)

## 2016-09-26 ENCOUNTER — Ambulatory Visit: Payer: Medicaid Other | Admitting: Physician Assistant

## 2016-10-10 ENCOUNTER — Ambulatory Visit: Payer: Medicaid Other | Admitting: Physician Assistant

## 2016-10-10 ENCOUNTER — Encounter: Payer: Self-pay | Admitting: Physician Assistant

## 2016-10-10 VITALS — BP 126/84 | HR 67 | Temp 97.9°F | Ht 67.0 in | Wt 121.5 lb

## 2016-10-10 DIAGNOSIS — N921 Excessive and frequent menstruation with irregular cycle: Secondary | ICD-10-CM

## 2016-10-10 DIAGNOSIS — R21 Rash and other nonspecific skin eruption: Secondary | ICD-10-CM

## 2016-10-10 DIAGNOSIS — K649 Unspecified hemorrhoids: Secondary | ICD-10-CM

## 2016-10-10 DIAGNOSIS — D649 Anemia, unspecified: Secondary | ICD-10-CM

## 2016-10-10 LAB — HEMOGLOBIN: Hemoglobin: 9.9 g/dL — ABNORMAL LOW (ref 11.7–15.5)

## 2016-10-10 LAB — HEMATOCRIT: HEMATOCRIT: 32.4 % — AB (ref 35.0–45.0)

## 2016-10-10 MED ORDER — HYDROCORTISONE ACE-PRAMOXINE 2.5-1 % RE CREA: 1.0000 "application " | TOPICAL_CREAM | Freq: Three times a day (TID) | RECTAL | 2 refills | Status: DC

## 2016-10-10 NOTE — Patient Instructions (Signed)
Hemorrhoids Hemorrhoids are swollen veins in and around the rectum or anus. There are two types of hemorrhoids:  Internal hemorrhoids. These occur in the veins that are just inside the rectum. They may poke through to the outside and become irritated and painful.  External hemorrhoids. These occur in the veins that are outside of the anus and can be felt as a painful swelling or hard lump near the anus.  Most hemorrhoids do not cause serious problems, and they can be managed with home treatments such as diet and lifestyle changes. If home treatments do not help your symptoms, procedures can be done to shrink or remove the hemorrhoids. What are the causes? This condition is caused by increased pressure in the anal area. This pressure may result from various things, including:  Constipation.  Straining to have a bowel movement.  Diarrhea.  Pregnancy.  Obesity.  Sitting for long periods of time.  Heavy lifting or other activity that causes you to strain.  Anal sex.  What are the signs or symptoms? Symptoms of this condition include:  Pain.  Anal itching or irritation.  Rectal bleeding.  Leakage of stool (feces).  Anal swelling.  One or more lumps around the anus.  How is this diagnosed? This condition can often be diagnosed through a visual exam. Other exams or tests may also be done, such as:  Examination of the rectal area with a gloved hand (digital rectal exam).  Examination of the anal canal using a small tube (anoscope).  A blood test, if you have lost a significant amount of blood.  A test to look inside the colon (sigmoidoscopy or colonoscopy).  How is this treated? This condition can usually be treated at home. However, various procedures may be done if dietary changes, lifestyle changes, and other home treatments do not help your symptoms. These procedures can help make the hemorrhoids smaller or remove them completely. Some of these procedures involve  surgery, and others do not. Common procedures include:  Rubber band ligation. Rubber bands are placed at the base of the hemorrhoids to cut off the blood supply to them.  Sclerotherapy. Medicine is injected into the hemorrhoids to shrink them.  Infrared coagulation. A type of light energy is used to get rid of the hemorrhoids.  Hemorrhoidectomy surgery. The hemorrhoids are surgically removed, and the veins that supply them are tied off.  Stapled hemorrhoidopexy surgery. A circular stapling device is used to remove the hemorrhoids and use staples to cut off the blood supply to them.  Follow these instructions at home: Eating and drinking  Eat foods that have a lot of fiber in them, such as whole grains, beans, nuts, fruits, and vegetables. Ask your health care provider about taking products that have added fiber (fiber supplements).  Drink enough fluid to keep your urine clear or pale yellow. Managing pain and swelling  Take warm sitz baths for 20 minutes, 3-4 times a day to ease pain and discomfort.  If directed, apply ice to the affected area. Using ice packs between sitz baths may be helpful. ? Put ice in a plastic bag. ? Place a towel between your skin and the bag. ? Leave the ice on for 20 minutes, 2-3 times a day. General instructions  Take over-the-counter and prescription medicines only as told by your health care provider.  Use medicated creams or suppositories as told.  Exercise regularly.  Go to the bathroom when you have the urge to have a bowel movement. Do not wait.    Avoid straining to have bowel movements.  Keep the anal area dry and clean. Use wet toilet paper or moist towelettes after a bowel movement.  Do not sit on the toilet for long periods of time. This increases blood pooling and pain. Contact a health care provider if:  You have increasing pain and swelling that are not controlled by treatment or medicine.  You have uncontrolled bleeding.  You  have difficulty having a bowel movement, or you are unable to have a bowel movement.  You have pain or inflammation outside the area of the hemorrhoids. This information is not intended to replace advice given to you by your health care provider. Make sure you discuss any questions you have with your health care provider. Document Released: 08/23/2000 Document Revised: 01/24/2016 Document Reviewed: 05/10/2015 Elsevier Interactive Patient Education  2017 Elsevier Inc.  

## 2016-10-10 NOTE — Progress Notes (Signed)
BP 126/84 (BP Location: Right Arm, Patient Position: Sitting, Cuff Size: Normal)   Pulse 67   Temp 97.9 F (36.6 C) (Other (Comment))   Ht 5\' 7"  (1.702 m)   Wt 121 lb 8 oz (55.1 kg)   SpO2 99%   BMI 19.03 kg/m    Subjective:    Patient ID: Sheila Berg, female    DOB: [birth year], 46 y.o.   MRN: [id number]  HPI: Sheila Berg is a 46 y.o. female presenting on 10/10/2016 for Anemia (wants to discuss Implanon removal due to bleeding)   HPI   Pt turned in [employer] discount application  Pt states has not been contacted about gyn referral made at [place] 3 months ago  Pt states hemorrhoids bothering her a lot again.  Pt c/o rash on forehead and around mouth.  She has been using dial soap to try to make it get better.    Relevant past medical, surgical, family and social history reviewed and updated as indicated. Interim medical history since our last visit reviewed. Allergies and medications reviewed and updated.   Current Outpatient Prescriptions:  .  etonogestrel (NEXPLANON) 68 MG IMPL implant, 1 each by Subdermal route once., Disp: , Rfl:  .  ferrous sulfate 325 (65 FE) MG tablet, Take 325 mg by mouth 2 (two) times daily., Disp: , Rfl:    Review of Systems  Constitutional: Positive for diaphoresis and fatigue. Negative for appetite change, chills, fever and unexpected weight change.  HENT: Negative for congestion, dental problem, drooling, ear pain, facial swelling, hearing loss, mouth sores, sneezing, sore throat, trouble swallowing and voice change.   Eyes: Positive for visual disturbance. Negative for pain, discharge, redness and itching.  Respiratory: Negative for cough, choking, shortness of breath and wheezing.   Cardiovascular: Negative for chest pain, palpitations and leg swelling.  Gastrointestinal: Negative for abdominal pain, blood in stool, constipation, diarrhea and vomiting.  Endocrine: Positive for cold intolerance. Negative for heat intolerance and polydipsia.   Genitourinary: Negative for decreased urine volume, dysuria and hematuria.  Musculoskeletal: Negative for arthralgias, back pain and gait problem.  Skin: Positive for rash.  Allergic/Immunologic: Negative for environmental allergies.  Neurological: Positive for light-headedness and headaches. Negative for seizures and syncope.  Hematological: Negative for adenopathy.  Psychiatric/Behavioral: Negative for agitation, dysphoric mood and suicidal ideas. The patient is not nervous/anxious.     Per HPI unless specifically indicated above     Objective:    BP 126/84 (BP Location: Right Arm, Patient Position: Sitting, Cuff Size: Normal)   Pulse 67   Temp 97.9 F (36.6 C) (Other (Comment))   Ht 5\' 7"  (1.702 m)   Wt 121 lb 8 oz (55.1 kg)   SpO2 99%   BMI 19.03 kg/m   Wt Readings from Last 3 Encounters:  10/10/16 121 lb 8 oz (55.1 kg)  06/27/16 125 lb 3.2 oz (56.8 kg)  02/29/16 122 lb (55.3 kg)    Physical Exam  Constitutional: She is oriented to person, place, and time. She appears well-developed and well-nourished.  HENT:  Head: Normocephalic and atraumatic.  Neck: Neck supple.  Cardiovascular: Normal rate and regular rhythm.   Pulmonary/Chest: Effort normal and breath sounds normal.  Abdominal: Soft. Bowel sounds are normal. She exhibits no mass. There is no hepatosplenomegaly. There is no tenderness.  Genitourinary: Rectal exam shows external hemorrhoid.  Genitourinary Comments: No thrombosed hemorrhoids (nurse Berenice assisted)  Musculoskeletal: She exhibits no edema.  Lymphadenopathy:    She has no cervical adenopathy.  Neurological: She is alert and oriented to person, place, and time.  Skin: Skin is warm and dry.  Psychiatric: She has a normal mood and affect. Her behavior is normal.  Vitals reviewed.       Assessment & Plan:    Encounter Diagnoses  Name Primary?  Marland Kitchen Anemia, unspecified type Yes  . Hemorrhoids, unspecified hemorrhoid type   . Menorrhagia with  irregular cycle   . Rash     -check h/h -recommended Metamucil to add fiber to help hemorrhoids -pt to use otc Prep h bid until mail rx arrives -Pt assistance program application for analpram hc filled out -Nurse will work on scheduling gyn appt -pt to use otc hydrocortisone to face- stop dial soap.  use dove or lever -follow up one month.  RTO sooner prn

## 2016-10-16 ENCOUNTER — Encounter: Payer: Self-pay | Admitting: Adult Health

## 2016-10-16 ENCOUNTER — Ambulatory Visit (INDEPENDENT_AMBULATORY_CARE_PROVIDER_SITE_OTHER): Payer: Self-pay | Admitting: Adult Health

## 2016-10-16 VITALS — BP 120/84 | HR 76 | Ht 68.0 in | Wt 123.0 lb

## 2016-10-16 DIAGNOSIS — N938 Other specified abnormal uterine and vaginal bleeding: Secondary | ICD-10-CM

## 2016-10-16 DIAGNOSIS — Z3046 Encounter for surveillance of implantable subdermal contraceptive: Secondary | ICD-10-CM

## 2016-10-16 DIAGNOSIS — Z30011 Encounter for initial prescription of contraceptive pills: Secondary | ICD-10-CM

## 2016-10-16 DIAGNOSIS — N921 Excessive and frequent menstruation with irregular cycle: Secondary | ICD-10-CM

## 2016-10-16 DIAGNOSIS — F17200 Nicotine dependence, unspecified, uncomplicated: Secondary | ICD-10-CM

## 2016-10-16 MED ORDER — NORETHINDRONE 0.35 MG PO TABS
1.0000 | ORAL_TABLET | Freq: Every day | ORAL | 11 refills | Status: DC
Start: 2016-10-16 — End: 2017-02-06

## 2016-10-16 NOTE — Patient Instructions (Signed)
Use condoms x 4 weeks, keep clean and dry x 24 hours, no heavy lifting, keep steri strips on x 72 hours, Keep pressure dressing on x 24 hours. Follow up prn problems.  

## 2016-10-16 NOTE — Progress Notes (Signed)
Subjective:     Patient ID: Sheila Berg, female   DOB: [birth year], 46 y.o.   MRN: [id number]  HPI [name] is a 46 year old black female in for nexplanon removal due to irregular bleeding and is now anemic.[facility]  Review of Systems For nexplanon removal Irregular bleeding Reviewed past medical,surgical, social and family history. Reviewed medications and allergies.     Objective:   Physical Exam BP 120/84 (BP Location: Right Arm, Patient Position: Sitting, Cuff Size: Normal)   Pulse 76   Ht 5\' 8"  (1.727 m)   Wt 123 lb (55.8 kg)   BMI 18.70 kg/m PHQ 2 score 0.Consent signed, time out called. Left arm cleansed with betadine, and injected with 1.5 cc 1% lidocaine and waited til numb.Under sterile technique a #11 blade was used to make small vertical incision, and a curved forceps was used to easily remove rod. Steri strips applied. Pressure dressing applied.   She wants th pill, since she smokes has to be POP.  Assessment:     Encounter for Nexplanon removal  Irregular intermenstrual bleeding  Encounter for initial prescription of contraceptive pills  Smoker      Plan:     Meds ordered this encounter  Medications  . norethindrone (MICRONOR,CAMILA,ERRIN) 0.35 MG tablet    Sig: Take 1 tablet (0.35 mg total) by mouth daily.    Dispense:  1 Package    Refill:  11    Order Specific Question:   Supervising Provider    Answer:   Florian Buff [[id number]]  Start micronor today    Use condoms x 4 weeks, keep clean and dry x 24 hours, no heavy lifting, keep steri strips on x 72 hours, Keep pressure dressing on x 24 hours. Follow up prn problems. Decrease smoking

## 2016-11-07 ENCOUNTER — Ambulatory Visit: Payer: Medicaid Other | Admitting: Physician Assistant

## 2016-11-07 ENCOUNTER — Encounter: Payer: Self-pay | Admitting: Physician Assistant

## 2016-11-07 VITALS — BP 114/72 | HR 97 | Temp 97.9°F | Ht 68.0 in | Wt 118.5 lb

## 2016-11-07 DIAGNOSIS — K649 Unspecified hemorrhoids: Secondary | ICD-10-CM

## 2016-11-07 DIAGNOSIS — R21 Rash and other nonspecific skin eruption: Secondary | ICD-10-CM

## 2016-11-07 DIAGNOSIS — F17210 Nicotine dependence, cigarettes, uncomplicated: Secondary | ICD-10-CM

## 2016-11-07 DIAGNOSIS — D649 Anemia, unspecified: Secondary | ICD-10-CM

## 2016-11-07 NOTE — Progress Notes (Signed)
BP 114/72 (BP Location: Left Arm, Patient Position: Sitting, Cuff Size: Normal)   Pulse 97   Temp 97.9 F (36.6 C) (Other (Comment))   Ht 5\' 8"  (1.727 m)   Wt 118 lb 8 oz (53.8 kg)   LMP 11/06/2016 (Exact Date) Comment: contraceptive implant removed 10-11-16, also had pap last wk @ [facility]  SpO2 99%   BMI 18.02 kg/m    Subjective:    Patient ID: Sheila Berg, female    DOB: 08/22/1971, 46 y.o.   MRN: [id number]  HPI: Sheila Berg is a 46 y.o. female presenting on 11/07/2016 for Anemia (plans to get IUD ASAP, Mirena?); Rash; and Hemorrhoids   HPI   Pt states face rash improved.  pts states Hemorrhoids not as bad.   Pt got implanon removed at [facility] and rx OCP.  Pt seen at [facility] and is hoping to get IUD.  She did not get the OCP filled.  Pt did not bring in tax paperwork so she did not get her analpram that we were going to order through pt assistance program.  Relevant past medical, surgical, family and social history reviewed and updated as indicated. Interim medical history since our last visit reviewed. Allergies and medications reviewed and updated.   Current Outpatient Prescriptions:  .  cephALEXin (KEFLEX) 500 MG capsule, Take 500 mg by mouth 2 (two) times daily., Disp: , Rfl:  .  ferrous sulfate 325 (65 FE) MG tablet, Take 325 mg by mouth 2 (two) times daily., Disp: , Rfl:  .  metroNIDAZOLE (FLAGYL) 500 MG tablet, Take 500 mg by mouth 2 (two) times daily., Disp: , Rfl:  .  etonogestrel (NEXPLANON) 68 MG IMPL implant, 1 each by Subdermal route once., Disp: , Rfl:  .  hydrocortisone-pramoxine (ANALPRAM HC) 2.5-1 % rectal cream, Place 1 application rectally 3 (three) times daily. (Patient not taking: Reported on 10/16/2016), Disp: 30 g, Rfl: 2 .  norethindrone (MICRONOR,CAMILA,ERRIN) 0.35 MG tablet, Take 1 tablet (0.35 mg total) by mouth daily. (Patient not taking: Reported on 11/07/2016), Disp: 1 Package, Rfl: 11   Review of Systems  Constitutional: Positive for  chills and diaphoresis. Negative for appetite change, fatigue, fever and unexpected weight change.  HENT: Negative for congestion, dental problem, drooling, ear pain, facial swelling, hearing loss, mouth sores, sneezing, sore throat, trouble swallowing and voice change.   Eyes: Positive for pain. Negative for discharge, redness, itching and visual disturbance.  Respiratory: Negative for cough, choking, shortness of breath and wheezing.   Cardiovascular: Negative for chest pain, palpitations and leg swelling.  Gastrointestinal: Negative for abdominal pain, blood in stool, constipation, diarrhea and vomiting.  Endocrine: Positive for cold intolerance. Negative for heat intolerance and polydipsia.  Genitourinary: Negative for decreased urine volume, dysuria and hematuria.  Musculoskeletal: Negative for arthralgias, back pain and gait problem.  Skin: Positive for rash.  Allergic/Immunologic: Negative for environmental allergies.  Neurological: Positive for headaches. Negative for seizures, syncope and light-headedness.  Hematological: Negative for adenopathy.  Psychiatric/Behavioral: Negative for agitation, dysphoric mood and suicidal ideas. The patient is not nervous/anxious.     Per HPI unless specifically indicated above     Objective:    BP 114/72 (BP Location: Left Arm, Patient Position: Sitting, Cuff Size: Normal)   Pulse 97   Temp 97.9 F (36.6 C) (Other (Comment))   Ht 5\' 8"  (1.727 m)   Wt 118 lb 8 oz (53.8 kg)   LMP 11/06/2016 (Exact Date) Comment: contraceptive implant removed 10-11-16, also  had pap last wk @ [facility]  SpO2 99%   BMI 18.02 kg/m   Wt Readings from Last 3 Encounters:  11/07/16 118 lb 8 oz (53.8 kg)  10/16/16 123 lb (55.8 kg)  10/10/16 121 lb 8 oz (55.1 kg)    Physical Exam  Constitutional: She is oriented to person, place, and time. She appears well-developed and well-nourished.  HENT:  Head: Normocephalic and atraumatic.  Neck: Neck supple.  Cardiovascular:  Normal rate and regular rhythm.   Pulmonary/Chest: Effort normal and breath sounds normal.  Abdominal: Soft. Bowel sounds are normal. She exhibits no mass. There is no hepatosplenomegaly. There is no tenderness.  Musculoskeletal: She exhibits no edema.  Lymphadenopathy:    She has no cervical adenopathy.  Neurological: She is alert and oriented to person, place, and time.  Skin: Skin is warm and dry.  Psychiatric: She has a normal mood and affect. Her behavior is normal.  Vitals reviewed.   Results for orders placed or performed in visit on 10/10/16  Hemoglobin  Result Value Ref Range   Hemoglobin 9.9 (L) 11.7 - 15.5 g/dL  Hematocrit  Result Value Ref Range   HCT 32.4 (L) 35.0 - 45.0 %      Assessment & Plan:    Encounter Diagnoses  Name Primary?  Marland Kitchen Anemia, unspecified type Yes  . Hemorrhoids, unspecified hemorrhoid type   . Cigarette nicotine dependence without complication   . Rash      Check h/h in one month Counseled smoking cessation F/u office visin in 3 months.  RTO sooner prn

## 2016-11-30 IMAGING — US US BREAST LTD UNI RIGHT INC AXILLA
1 series · 4 of 4 positions shown · non-contrast
Comparison: Baseline exam

CLINICAL DATA: Palpable abnormalities bilaterally. The patient
reports recently completing 2 courses of antibiotics for a tender
palpable mass in the lower portion of the right breast. She thinks
this mass has resolved. However, 2 palpable nodules were noted in
the right breast and 1 in the left breast on recent physical exam.

EXAM:
DIGITAL DIAGNOSTIC BILATERAL MAMMOGRAM WITH CAD
ULTRASOUND BILATERAL BREAST

[Series 1: us breast ltd uni right inc axilla · 0.06mm/px · 4 of 4 slices shown]
[im 1/4]
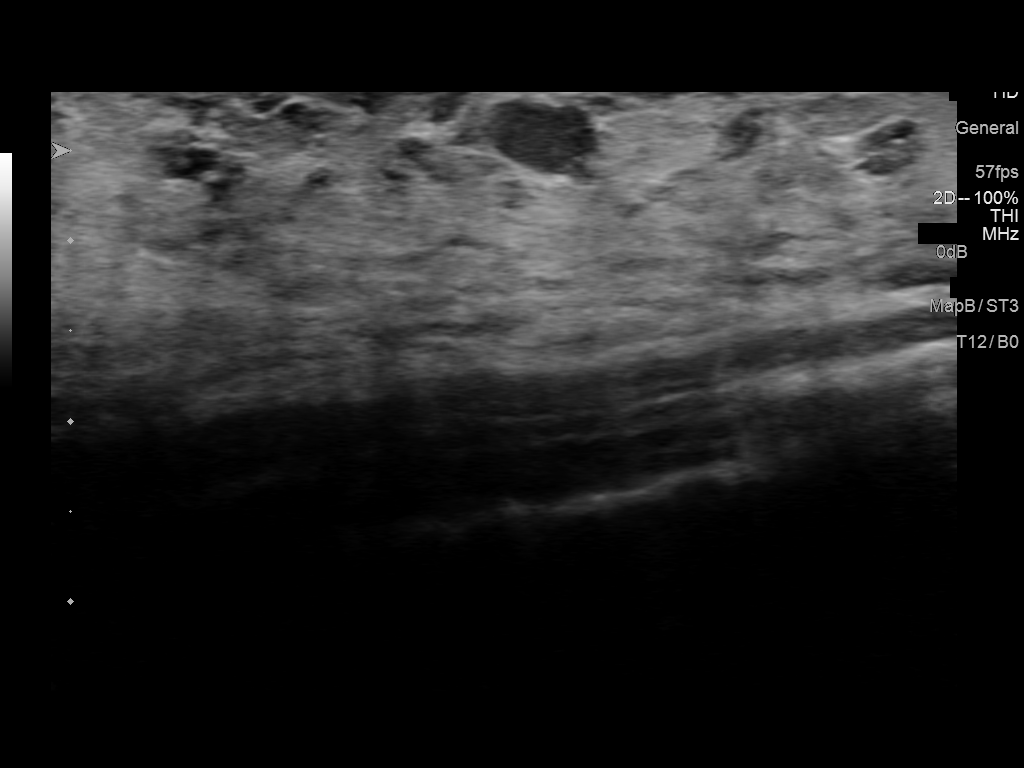
[im 2/4]
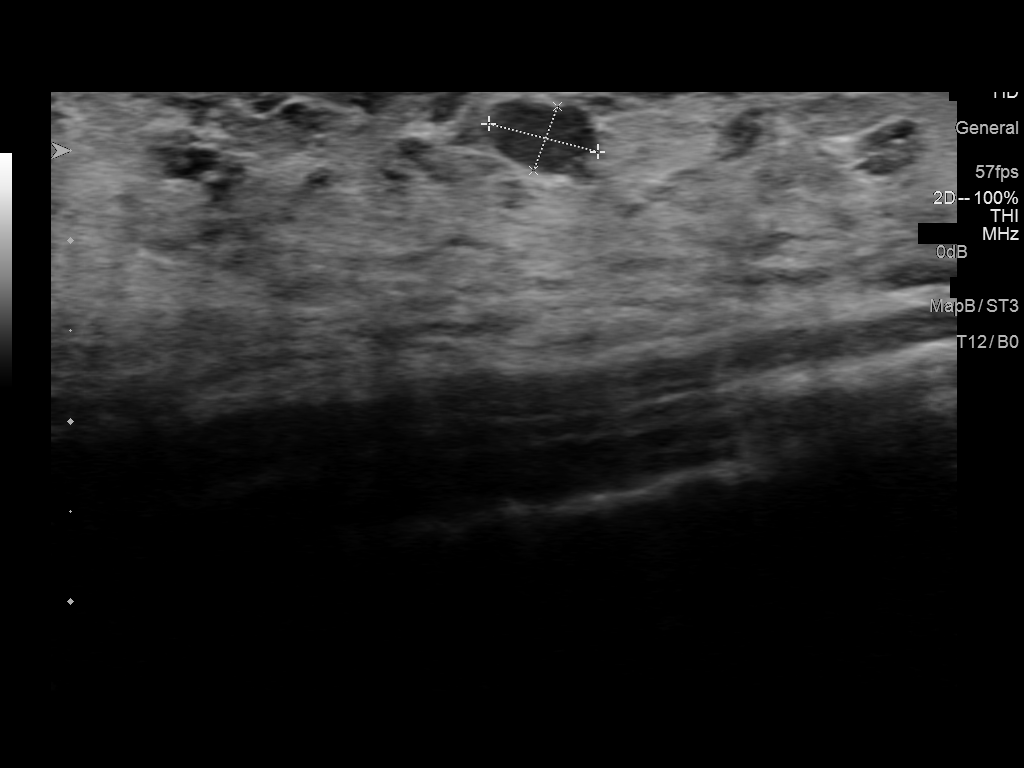
[im 3/4]
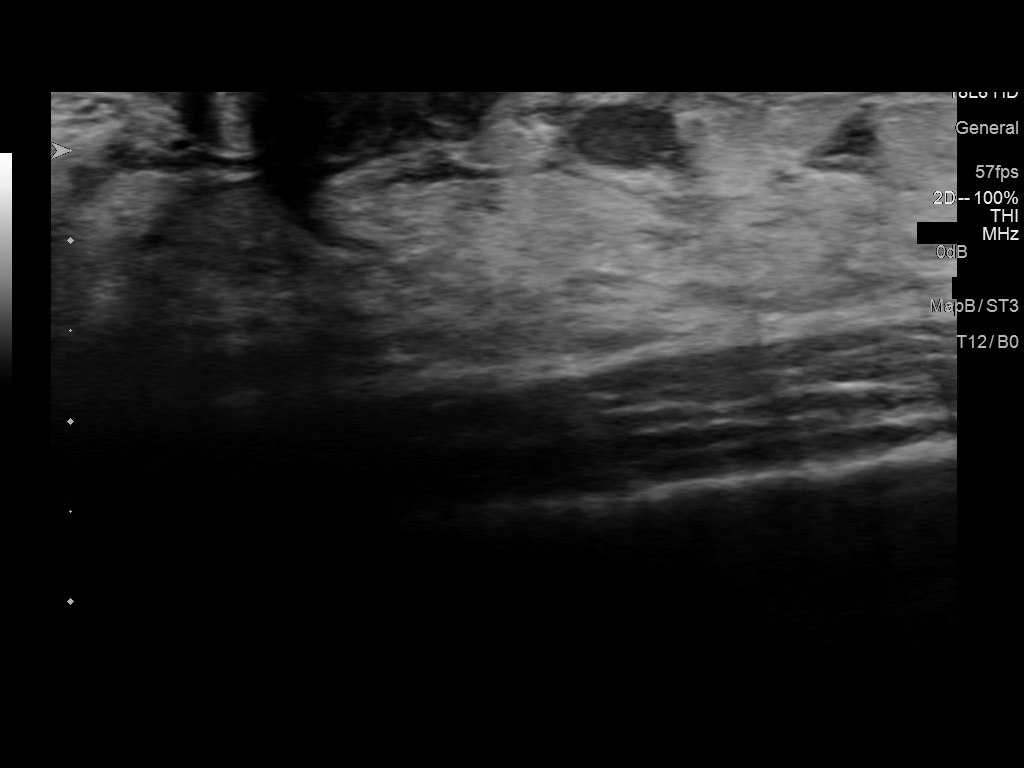
[im 4/4]
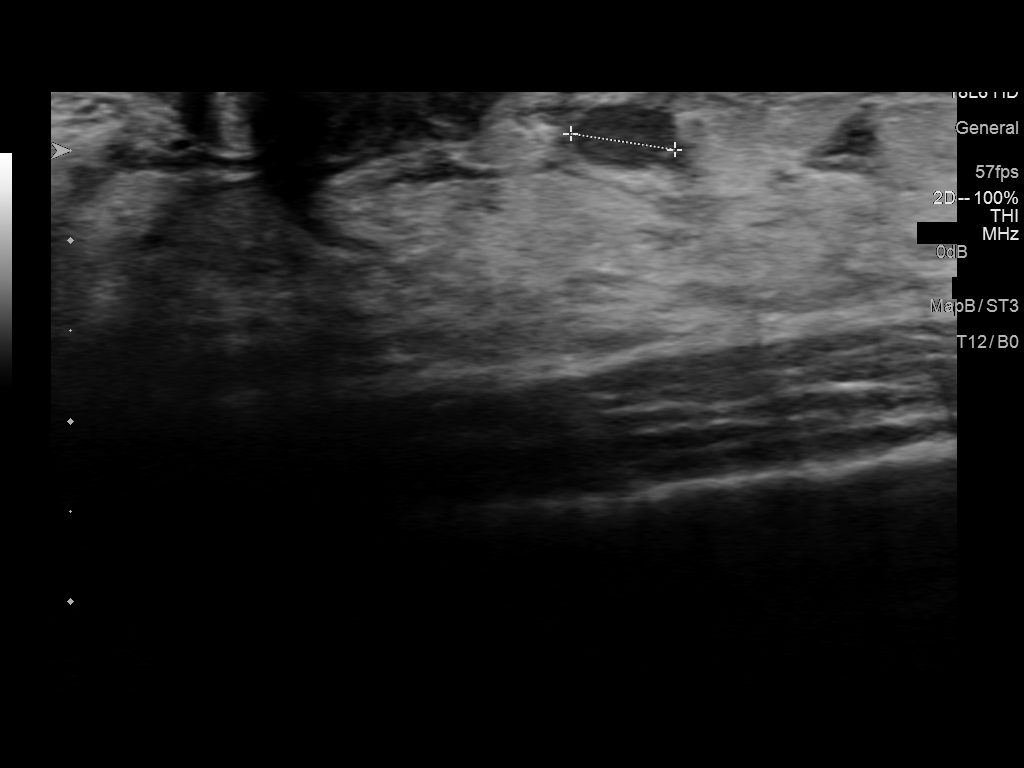

[4 of 4 positions shown; findings below may reference images not displayed]

ACR Breast Density Category d: The breast tissue is extremely dense,
which lowers the sensitivity of mammography.
FINDINGS: No suspicious mass, distortion, or microcalcifications are
identified to suggest presence of malignancy. Spot tangential views
of the areas of concern bilaterally are negative.

Mammographic images were processed with CAD.

On physical exam, I palpate no abnormality in the lower outer
periareolar region of the right breast. I palpate a discrete mobile
mass in the retroareolar region of the 3 o'clock location of the
right breast.

I palpate no abnormality in the lateral aspect of the left breast.

Targeted ultrasound is performed, showing normal appearing dense
fibroglandular tissue in the lower outer quadrant of the right
breast periareolar region. In the 3 o'clock location of the right
breast 1 cm from the nipple there is a small circumscribed
hypoechoic nodule corresponding to the palpable abnormality. This
nodule measures 0.6 x 0.4 x 0.6 cm. There is associated increased
through transmission.

In the lateral aspect of the left breast, dense fibroglandular
tissue is imaged. No suspicious mass, distortion, or acoustic
shadowing is demonstrated with ultrasound.
IMPRESSION: 1. Small probable fibroadenoma in the 3 o'clock retroareolar region
of the right breast warrants follow-up to document stability.
2. No mammographic or sonographic findings in the other areas of
concern.

RECOMMENDATION:
Right breast ultrasound suggested in 6 months to assess stability of
probable fibroadenoma in the 3 o'clock location of the right breast.

I have discussed the findings and recommendations with the patient.
Results were also provided in writing at the conclusion of the
visit. If applicable, a reminder letter will be sent to the patient
regarding the next appointment.

BI-RADS CATEGORY  3: Probably benign.

## 2016-12-05 ENCOUNTER — Other Ambulatory Visit: Payer: Self-pay

## 2016-12-05 DIAGNOSIS — D649 Anemia, unspecified: Secondary | ICD-10-CM

## 2016-12-10 ENCOUNTER — Other Ambulatory Visit (HOSPITAL_COMMUNITY): Payer: Self-pay | Admitting: *Deleted

## 2016-12-10 ENCOUNTER — Other Ambulatory Visit: Payer: Self-pay | Admitting: Physician Assistant

## 2016-12-10 DIAGNOSIS — N6310 Unspecified lump in the right breast, unspecified quadrant: Secondary | ICD-10-CM

## 2016-12-10 DIAGNOSIS — R928 Other abnormal and inconclusive findings on diagnostic imaging of breast: Secondary | ICD-10-CM

## 2016-12-10 DIAGNOSIS — Z09 Encounter for follow-up examination after completed treatment for conditions other than malignant neoplasm: Secondary | ICD-10-CM

## 2016-12-13 ENCOUNTER — Encounter (HOSPITAL_COMMUNITY): Payer: Self-pay | Admitting: *Deleted

## 2016-12-24 ENCOUNTER — Ambulatory Visit (HOSPITAL_COMMUNITY)
Admission: RE | Admit: 2016-12-24 | Discharge: 2016-12-24 | Disposition: A | Discharge status: Home / Self Care | Payer: PRIVATE HEALTH INSURANCE | Source: Ambulatory Visit | Attending: *Deleted | Admitting: *Deleted

## 2016-12-24 ENCOUNTER — Encounter (HOSPITAL_COMMUNITY): Payer: Self-pay | Admitting: Radiology

## 2016-12-24 DIAGNOSIS — N6310 Unspecified lump in the right breast, unspecified quadrant: Secondary | ICD-10-CM | POA: Diagnosis present

## 2016-12-24 DIAGNOSIS — N6001 Solitary cyst of right breast: Secondary | ICD-10-CM | POA: Insufficient documentation

## 2016-12-24 DIAGNOSIS — Z09 Encounter for follow-up examination after completed treatment for conditions other than malignant neoplasm: Secondary | ICD-10-CM

## 2016-12-24 LAB — HEMOGLOBIN: Hemoglobin: 9 g/dL — ABNORMAL LOW (ref 11.7–15.5)

## 2016-12-24 LAB — HEMATOCRIT: HEMATOCRIT: 30.7 % — AB (ref 35.0–45.0)

## 2017-02-06 ENCOUNTER — Encounter: Payer: Self-pay | Admitting: Physician Assistant

## 2017-02-06 ENCOUNTER — Ambulatory Visit: Payer: Medicaid Other | Admitting: Physician Assistant

## 2017-02-06 VITALS — BP 110/80 | HR 100 | Temp 98.1°F | Ht 68.0 in | Wt 120.5 lb

## 2017-02-06 DIAGNOSIS — F17210 Nicotine dependence, cigarettes, uncomplicated: Secondary | ICD-10-CM

## 2017-02-06 DIAGNOSIS — Z0289 Encounter for other administrative examinations: Secondary | ICD-10-CM

## 2017-02-06 DIAGNOSIS — D649 Anemia, unspecified: Secondary | ICD-10-CM

## 2017-02-06 DIAGNOSIS — K625 Hemorrhage of anus and rectum: Secondary | ICD-10-CM

## 2017-02-06 DIAGNOSIS — K649 Unspecified hemorrhoids: Secondary | ICD-10-CM

## 2017-02-06 NOTE — Progress Notes (Signed)
BP 110/80 (BP Location: Left Arm, Patient Position: Sitting, Cuff Size: Normal)   Pulse 100   Temp 98.1 F (36.7 C) (Other (Comment))   Ht 5\' 8"  (1.727 m)   Wt 120 lb 8 oz (54.7 kg)   SpO2 99%   BMI 18.32 kg/m    Subjective:    Patient ID: Sheila Berg, female    DOB: 10-19-1970, 46 y.o.   MRN: [id number]  HPI: Sheila Berg is a 46 y.o. female presenting on 02/06/2017 for Anemia (nursing school paperwork)   HPI   pt starting nursing school in the fall at [employer]  Pt got the IUD in March to control her heavy menses.  She says it is working and she now only has a little spotting and that is only rarely.   Pt is feeling well  Pt says she is still having bleeding from her hemorrhoids    Relevant past medical, surgical, family and social history reviewed and updated as indicated. Interim medical history since our last visit reviewed. Allergies and medications reviewed and updated.   Current Outpatient Prescriptions:  .  ferrous sulfate 325 (65 FE) MG tablet, Take 325 mg by mouth 2 (two) times daily., Disp: , Rfl:  .  levonorgestrel (MIRENA) 20 MCG/24HR IUD, 1 each by Intrauterine route once., Disp: , Rfl:    Review of Systems  Constitutional: Positive for diaphoresis. Negative for appetite change, chills, fatigue, fever and unexpected weight change.  HENT: Negative for congestion, drooling, ear pain, facial swelling, hearing loss, mouth sores, sneezing, sore throat, trouble swallowing and voice change.   Eyes: Positive for pain and itching. Negative for discharge, redness and visual disturbance.  Respiratory: Negative for cough, choking, shortness of breath and wheezing.   Cardiovascular: Negative for chest pain, palpitations and leg swelling.  Gastrointestinal: Negative for abdominal pain, blood in stool, constipation, diarrhea and vomiting.  Endocrine: Positive for cold intolerance. Negative for heat intolerance and polydipsia.  Genitourinary: Negative for decreased urine  volume, dysuria and hematuria.  Musculoskeletal: Negative for arthralgias, back pain and gait problem.  Skin: Positive for rash.  Allergic/Immunologic: Negative for environmental allergies.  Neurological: Negative for seizures, syncope, light-headedness and headaches.  Hematological: Negative for adenopathy.  Psychiatric/Behavioral: Negative for agitation, dysphoric mood and suicidal ideas. The patient is not nervous/anxious.     Per HPI unless specifically indicated above     Objective:    BP 110/80 (BP Location: Left Arm, Patient Position: Sitting, Cuff Size: Normal)   Pulse 100   Temp 98.1 F (36.7 C) (Other (Comment))   Ht 5\' 8"  (1.727 m)   Wt 120 lb 8 oz (54.7 kg)   SpO2 99%   BMI 18.32 kg/m   Wt Readings from Last 3 Encounters:  02/06/17 120 lb 8 oz (54.7 kg)  11/07/16 118 lb 8 oz (53.8 kg)  10/16/16 123 lb (55.8 kg)    Physical Exam  Constitutional: She is oriented to person, place, and time. She appears well-developed and well-nourished.  HENT:  Head: Normocephalic and atraumatic.  Neck: Neck supple.  Cardiovascular: Normal rate and regular rhythm.   Pulmonary/Chest: Effort normal and breath sounds normal.  Abdominal: Soft. Bowel sounds are normal. She exhibits no mass. There is no hepatosplenomegaly. There is no tenderness.  Musculoskeletal: She exhibits no edema.  Lymphadenopathy:    She has no cervical adenopathy.  Neurological: She is alert and oriented to person, place, and time.  Skin: Skin is warm and dry.  Psychiatric: She has a  normal mood and affect. Her behavior is normal.  Vitals reviewed.   Results for orders placed or performed in visit on 12/05/16  Hemoglobin  Result Value Ref Range   Hemoglobin 9.0 (L) 11.7 - 15.5 g/dL  Hematocrit  Result Value Ref Range   HCT 30.7 (L) 35.0 - 45.0 %      Assessment & Plan:   Encounter Diagnoses  Name Primary?  Marland Kitchen Anemia, unspecified type Yes  . Rectal bleeding   . Hemorrhoids, unspecified hemorrhoid  type   . Cigarette nicotine dependence without complication   . 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   Pt says it is likely her hemorrhoids.  This should not however cause the anemia to worsen.  Will Refer to GI for further evaluation of rectal bleeding.  ( Pt has Hx GI bleed due to NSAIDs) -pt given [employer] discount application -pt to follow up in 3 months. RTO sooner prn

## 2017-04-13 ENCOUNTER — Encounter (HOSPITAL_COMMUNITY): Payer: Self-pay | Admitting: *Deleted

## 2017-04-13 ENCOUNTER — Emergency Department (HOSPITAL_COMMUNITY): Payer: Self-pay

## 2017-04-13 ENCOUNTER — Emergency Department (HOSPITAL_COMMUNITY)
Admission: EM | Admit: 2017-04-13 | Discharge: 2017-04-13 | Disposition: A | Discharge status: Home / Self Care | Payer: Self-pay | Attending: Emergency Medicine | Admitting: Emergency Medicine

## 2017-04-13 DIAGNOSIS — D509 Iron deficiency anemia, unspecified: Secondary | ICD-10-CM | POA: Insufficient documentation

## 2017-04-13 DIAGNOSIS — F17210 Nicotine dependence, cigarettes, uncomplicated: Secondary | ICD-10-CM | POA: Insufficient documentation

## 2017-04-13 DIAGNOSIS — Z79899 Other long term (current) drug therapy: Secondary | ICD-10-CM | POA: Insufficient documentation

## 2017-04-13 DIAGNOSIS — K610 Anal abscess: Secondary | ICD-10-CM | POA: Insufficient documentation

## 2017-04-13 LAB — CBC WITH DIFFERENTIAL/PLATELET
BASOS ABS: 0.2 10*3/uL — AB (ref 0.0–0.1)
Basophils Relative: 3 %
EOS ABS: 0.3 10*3/uL (ref 0.0–0.7)
EOS PCT: 4 %
HCT: 31.5 % — ABNORMAL LOW (ref 36.0–46.0)
HEMOGLOBIN: 9.9 g/dL — AB (ref 12.0–15.0)
Lymphocytes Relative: 35 %
Lymphs Abs: 2.5 10*3/uL (ref 0.7–4.0)
MCH: 23.6 pg — ABNORMAL LOW (ref 26.0–34.0)
MCHC: 31.4 g/dL (ref 30.0–36.0)
MCV: 75.2 fL — ABNORMAL LOW (ref 78.0–100.0)
Monocytes Absolute: 0.8 10*3/uL (ref 0.1–1.0)
Monocytes Relative: 11 %
NEUTROS PCT: 47 %
Neutro Abs: 3.4 10*3/uL (ref 1.7–7.7)
PLATELETS: 400 10*3/uL (ref 150–400)
RBC: 4.19 MIL/uL (ref 3.87–5.11)
RDW: 18.7 % — ABNORMAL HIGH (ref 11.5–15.5)
WBC: 7.1 10*3/uL (ref 4.0–10.5)

## 2017-04-13 LAB — BASIC METABOLIC PANEL
ANION GAP: 5 (ref 5–15)
BUN: 11 mg/dL (ref 6–20)
CHLORIDE: 109 mmol/L (ref 101–111)
CO2: 24 mmol/L (ref 22–32)
Calcium: 8.7 mg/dL — ABNORMAL LOW (ref 8.9–10.3)
Creatinine, Ser: 0.69 mg/dL (ref 0.44–1.00)
Glucose, Bld: 98 mg/dL (ref 65–99)
POTASSIUM: 3.6 mmol/L (ref 3.5–5.1)
SODIUM: 138 mmol/L (ref 135–145)

## 2017-04-13 MED ORDER — IOPAMIDOL (ISOVUE-300) INJECTION 61%
100.0000 mL | Freq: Once | INTRAVENOUS | Status: AC | PRN
  Administered 2017-04-13: 100 mL via INTRAVENOUS

## 2017-04-13 MED ORDER — OXYCODONE-ACETAMINOPHEN 5-325 MG PO TABS: 1.0000 | ORAL_TABLET | ORAL | 0 refills | Status: DC | PRN

## 2017-04-13 MED ORDER — CEPHALEXIN 500 MG PO CAPS: 500.0000 mg | ORAL_CAPSULE | Freq: Four times a day (QID) | ORAL | 0 refills | Status: DC

## 2017-04-13 MED ORDER — CEPHALEXIN 500 MG PO CAPS
500.0000 mg | ORAL_CAPSULE | Freq: Once | ORAL | Status: AC
  Administered 2017-04-13: 500 mg via ORAL
  Filled 2017-04-13: qty 1

## 2017-04-13 NOTE — ED Notes (Signed)
Patient transported to CT 

## 2017-04-13 NOTE — ED Notes (Signed)
ED Provider at bedside. 

## 2017-04-13 NOTE — ED Provider Notes (Signed)
[facility] DEPT Provider Note   CSN: [id number] Arrival date & time: 04/13/17  0035     History   Chief Complaint Chief Complaint  Patient presents with  . Rectal Pain    HPI Sheila Berg is a 46 y.o. female.  The history is provided by the patient.  She has a long history of hemorrhoids. Over the last 2 months, she has developed some raised areas in the perianal area. These are painful, especially when she tries to sit on them. She rates pain at 10/10 if she sits. [facility] and test was done for herpes and was negative. She denies fever or chills. Hemorrhoids continue to bother her, but have not changed recently.  Past Medical History:  Diagnosis Date  . Anemia   . Anxiety   . Boil 02/08/2016  . Contraceptive use education 07/17/2015  . Elevated BP 07/31/2015  . Hemorrhoids   . 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 Irregular intermenstrual bleeding 02/08/2016  . Migraine   . Nexplanon in place 02/08/2016  . Nexplanon insertion 07/31/2015   Inserted left arm 07/31/15   . Trichimoniasis 02/12/2016    Patient Active Problem List   Diagnosis Date Noted  . Encounter for initial prescription of contraceptive pills 10/16/2016  . Smoker 10/16/2016  . Encounter for Nexplanon removal 10/16/2016  . Trichimoniasis 02/12/2016  . 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 Irregular intermenstrual bleeding 02/08/2016  . Boil 02/08/2016  . Hemorrhoid 12/01/2015  . Elevated BP 07/31/2015  . Contraceptive use education 07/17/2015  . Symptomatic anemia 04/10/2015  . Microcytic anemia 04/10/2015  . Chronic headaches 04/10/2015  . Tobacco use 04/10/2015  . GI bleed due to NSAIDs 04/10/2015    Past Surgical History:  Procedure Laterality Date  . -    . COLONOSCOPY N/A 04/21/2015   Procedure: COLONOSCOPY;  Surgeon: Rogene Houston, MD;  Location: [facility];  Service: Endoscopy;  Laterality: N/A;  110 - moved to 1:20 - Ann to  notify   . ESOPHAGOGASTRODUODENOSCOPY N/A 04/21/2015   Procedure: ESOPHAGOGASTRODUODENOSCOPY (EGD);  Surgeon: Rogene Houston, MD;  Location: [facility];  Service: Endoscopy;  Laterality: N/A;    OB History    Gravida Para Term Preterm AB Living   4 1 1   2 1    SAB TAB Ectopic Multiple Live Births   1               Home Medications    Prior to Admission medications   Medication Sig Start Date End Date Taking? Authorizing Provider  ferrous sulfate 325 (65 FE) MG tablet Take 325 mg by mouth 2 (two) times daily.    [provider]  levonorgestrel (MIRENA) 20 MCG/24HR IUD 1 each by Intrauterine route once.    [provider]    Family History Family History  Problem Relation Age of Onset  . Diabetes Mother   . Hypertension Mother   . Hypertension Father   . Breast cancer Maternal Aunt   . Diabetes Paternal Aunt   . Diabetes Maternal Grandmother   . Diabetes Paternal Aunt   . Stroke Maternal Aunt   . Kidney failure Maternal Aunt     Social History Social History  Substance Use Topics  . Smoking status: Current Every Day Smoker    Packs/day: 0.50    Years: 26.00    Types: Cigarettes  . Smokeless tobacco: Never Used  . Alcohol use Yes  Comment: sometimes     Allergies   Patient has no known allergies.   Review of Systems Review of Systems  All other systems reviewed and are negative.    Physical Exam Updated Vital Signs BP (!) 139/92 (BP Location: Left Arm)   Pulse 77   Temp 98.8 F (37.1 C) (Oral)   Resp 20   Ht 5\' 7"  (1.702 m)   Wt 54.9 kg (121 lb)   SpO2 100%   BMI 18.95 kg/m   Physical Exam  Nursing note and vitals reviewed.  46 year old female, resting comfortably and in no acute distress. Vital signs are significant for borderline hypertension. Oxygen saturation is 100%, which is normal. Head is normocephalic and atraumatic. PERRLA, EOMI. Oropharynx is clear. Neck is nontender and supple without adenopathy or  JVD. Back is nontender and there is no CVA tenderness. Lungs are clear without rales, wheezes, or rhonchi. Chest is nontender. Heart has regular rate and rhythm without murmur. Abdomen is soft, flat, nontender without masses or hepatosplenomegaly and peristalsis is normoactive. Rectal: Internal and external hemorrhoids present, not bleeding indurated areas are present on both gluteal cheeks in the perianal area, and are tender. Right is more prominent than left. Areas of ulceration are present with some clear drainage. Extremities have no cyanosis or edema, full range of motion is present. Skin is warm and dry without rash. Neurologic: Mental status is normal, cranial nerves are intact, there are no motor or sensory deficits.  ED Treatments / Results  Labs (all labs ordered are listed, but only abnormal results are displayed) Labs Reviewed  CBC WITH DIFFERENTIAL/PLATELET - Abnormal; Notable for the following:       Result Value   Hemoglobin 9.9 (*)    HCT 31.5 (*)    MCV 75.2 (*)    MCH 23.6 (*)    RDW 18.7 (*)    Basophils Absolute 0.2 (*)    All other components within normal limits  BASIC METABOLIC PANEL - Abnormal; Notable for the following:    Calcium 8.7 (*)    All other components within normal limits     Radiology Ct Pelvis W Contrast  Result Date: 04/13/2017 CLINICAL DATA:  46 year old female with abscess in the left rectal area. History of hemorrhoids EXAM: CT PELVIS WITH CONTRAST TECHNIQUE: Multidetector CT imaging of the pelvis was performed using the standard protocol following the bolus administration of intravenous contrast. CONTRAST:  130mL ISOVUE-300 IOPAMIDOL (ISOVUE-300) INJECTION 61% COMPARISON:  CT of the abdomen pelvis dated 09/13/2011 FINDINGS: Urinary Tract: The ureters appear unremarkable. Mild thickened appearance of the bladder wall. Correlation with urinalysis recommended to exclude cystitis. Bowel: No bowel dilatation or evidence of obstruction in the  pelvis. Vascular/Lymphatic: No pathologically enlarged lymph nodes. No significant vascular abnormality seen. Reproductive: The uterus is heterogeneous and anteverted. Multiple fibroids noted in the body and fundus of the uterus. There is an inferiorly displaced IUD in the lower uterus. The arms of the IUD appear to extend into the myometrium. Recommend correlation with ultrasound and direct visualization. Other:  None Musculoskeletal: There is a 2.8 x 1.2 cm area of inflammatory changes and thickening with small fluid collection in the superficial soft tissues of the left gluteal region along the intergluteal cleft. A smaller fluid collection in the right gluteal superficial soft tissues measures 0.8 x 2.2 cm. IMPRESSION: 1. Small abscesses in the subcutaneous soft tissues of the gluteal region along the intergluteal cleft. 2. 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 3. Mild apparent thickening of the bladder mucosa. Correlation with urinalysis recommended to exclude UTI. Electronically Signed   By: Anner Crete M.D.   On: 04/13/2017 03:04    Procedures Procedures (including critical care time)  Medications Ordered in ED Medications  cephALEXin (KEFLEX) capsule 500 mg (not administered)  iopamidol (ISOVUE-300) 61 % injection 100 mL (100 mLs Intravenous Contrast Given 04/13/17 0225)     Initial Impression / Assessment and Plan / ED Course  I have reviewed the triage vital signs and the nursing notes.  Pertinent labs & imaging results that were available during my care of the patient were reviewed by me and considered in my medical decision making (see chart for details).  Perianal lesions of uncertain cause. Appearance is suspicious for perianal abscess, but duration of symptoms would make that unlikely. All records are reviewed showing numerous office visits for hemorrhoids, colonoscopy in 2016 showing internal and external hemorrhoids and a  single polyp, no other lesions in the colon. She will be sent for CT scan for further evaluation.  CT confirms small perianal abscesses bilaterally. Patient was offered incision and drainage in the ED, but she has declined. She states she hadn't an abscess drained once before, and was very painful. I have advised her that these would eventually need drainage. However, since they are bilateral, this may be better managed in an operating room. She is referred to general surgery and discharged with prescription for cephalexin as well as oxycodone-acetaminophen. Advised on sitz bath. Return precautions discussed.  Final Clinical Impressions(s) / ED Diagnoses   Final diagnoses:  Perianal abscess  Microcytic anemia    New Prescriptions New Prescriptions   CEPHALEXIN (KEFLEX) 500 MG CAPSULE    Take 1 capsule (500 mg total) by mouth 4 (four) times daily.   OXYCODONE-ACETAMINOPHEN (PERCOCET/ROXICET) 5-325 MG TABLET    Take 1 tablet by mouth every 4 (four) hours as needed.     Delora Fuel, MD 06/77/03 [phone]

## 2017-04-13 NOTE — Discharge Instructions (Signed)
Soak in warm water several times a day. Return if symptoms are not being adequately controlled at home.

## 2017-04-13 NOTE — ED Notes (Signed)
Pt updated on plan of care,  

## 2017-04-13 NOTE — ED Triage Notes (Signed)
Pt c/o raised areas around rectum that is causing her pain and unable to sit down; the pain has gotten progressively worse in the last 2 weeks

## 2017-04-13 NOTE — ED Notes (Signed)
Pt returned from xray,  

## 2017-05-05 ENCOUNTER — Ambulatory Visit: Payer: Medicaid Other | Admitting: Physician Assistant

## 2017-05-08 ENCOUNTER — Ambulatory Visit: Payer: Medicaid Other | Admitting: Physician Assistant

## 2017-05-14 ENCOUNTER — Encounter: Payer: Self-pay | Admitting: Physician Assistant

## 2017-06-09 ENCOUNTER — Ambulatory Visit: Payer: Self-pay | Admitting: Physician Assistant

## 2017-08-27 ENCOUNTER — Ambulatory Visit: Payer: Self-pay | Admitting: Physician Assistant

## 2017-08-27 ENCOUNTER — Encounter: Payer: Self-pay | Admitting: Physician Assistant

## 2017-08-27 VITALS — BP 140/90 | HR 90 | Temp 98.1°F | Wt 118.0 lb

## 2017-08-27 DIAGNOSIS — R21 Rash and other nonspecific skin eruption: Secondary | ICD-10-CM

## 2017-08-27 DIAGNOSIS — F17210 Nicotine dependence, cigarettes, uncomplicated: Secondary | ICD-10-CM

## 2017-08-27 DIAGNOSIS — D649 Anemia, unspecified: Secondary | ICD-10-CM

## 2017-08-27 DIAGNOSIS — K649 Unspecified hemorrhoids: Secondary | ICD-10-CM

## 2017-08-27 DIAGNOSIS — K611 Rectal abscess: Secondary | ICD-10-CM

## 2017-08-27 DIAGNOSIS — K625 Hemorrhage of anus and rectum: Secondary | ICD-10-CM

## 2017-08-27 DIAGNOSIS — R030 Elevated blood-pressure reading, without diagnosis of hypertension: Secondary | ICD-10-CM

## 2017-08-27 MED ORDER — CEPHALEXIN 500 MG PO CAPS: 500.0000 mg | ORAL_CAPSULE | Freq: Three times a day (TID) | ORAL | 0 refills | Status: DC

## 2017-08-27 NOTE — Patient Instructions (Signed)
Financial counselor- [phone]   

## 2017-08-27 NOTE — Progress Notes (Signed)
BP 140/90 (BP Location: Left Arm, Patient Position: Sitting, Cuff Size: Normal)   Pulse 90   Temp 98.1 F (36.7 C)   Wt 118 lb (53.5 kg)   SpO2 99%   BMI 18.48 kg/m    Subjective:    Patient ID: Sheila Berg, female    DOB: 07/07/1971, 45 y.o.   MRN: [id number]  HPI: Sheila Berg is a 46 y.o. 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   Pt is still having cycle with her mirena.  She is still having rectal bleeding bleeding as well.  She says she always get drainage from the rectal area- pus - that has been coming since she was seen in the ER 4 months ago.  She was seen in ER in August and diagnosed with rectal abscesses (pt had CT scan that showed this).  Pt also has internal and external hemorrhoids seen on colonoscopy.    Pt states still has rashy face- has been using gently soap/dove and otc hydrocortisone but still with dryness and rash.   This has been going on for over a year.   Pt is still smoking.   Relevant past medical, surgical, family and social history reviewed and updated as indicated. Interim medical history since our last visit reviewed. Allergies and medications reviewed and updated.  Review of Systems  Constitutional: Negative for appetite change, chills, diaphoresis, fatigue, fever and unexpected weight change.  HENT: Negative for congestion, dental problem, drooling, ear pain, facial swelling, hearing loss, mouth sores, sneezing, sore throat, trouble swallowing and voice change.   Eyes: Negative for pain, discharge, redness, itching and visual disturbance.  Respiratory: Negative for cough, choking, shortness of breath and wheezing.   Cardiovascular: Negative for chest pain, palpitations and leg swelling.  Gastrointestinal: Negative for abdominal pain, blood in stool, constipation, diarrhea and vomiting.  Endocrine: Positive for cold intolerance. Negative for heat intolerance and polydipsia.   Genitourinary: Negative for decreased urine volume, dysuria and hematuria.  Musculoskeletal: Negative for arthralgias, back pain and gait problem.  Skin: Positive for rash.  Allergic/Immunologic: Negative for environmental allergies.  Neurological: Negative for seizures, syncope, light-headedness and headaches.  Hematological: Negative for adenopathy.  Psychiatric/Behavioral: Negative for agitation, dysphoric mood and suicidal ideas. The patient is not nervous/anxious.     Per HPI unless specifically indicated above     Objective:    BP 140/90 (BP Location: Left Arm, Patient Position: Sitting, Cuff Size: Normal)   Pulse 90   Temp 98.1 F (36.7 C)   Wt 118 lb (53.5 kg)   SpO2 99%   BMI 18.48 kg/m   Wt Readings from Last 3 Encounters:  08/27/17 118 lb (53.5 kg)  04/13/17 121 lb (54.9 kg)  02/06/17 120 lb 8 oz (54.7 kg)    Physical Exam  Constitutional: She is oriented to person, place, and time. She appears well-developed and well-nourished.  HENT:  Head: Normocephalic and atraumatic.  Neck: Neck supple.  Cardiovascular: Normal rate and regular rhythm.  Pulmonary/Chest: Effort normal and breath sounds normal.  Abdominal: Soft. Bowel sounds are normal. She exhibits no mass. There is no hepatosplenomegaly. There is no tenderness.  Genitourinary:  Genitourinary Comments: Pt with non thrombosed ext hemorrhoids.  Pt with moderate amount of thick purulence peri-rectally.  No discrete abscess seen externally. (pt states the pus is always there draining).  There is mild tenderness in the general area.  (nurse Berenice assisted)  Musculoskeletal: She exhibits no edema.  Lymphadenopathy:    She has no cervical adenopathy.  Neurological: She is alert and oriented to person, place, and time.  Skin: Skin is warm and dry. Rash noted.  Face with dry flaky skin around mouth especially and reddish areas and small bumps in reddish areas.   Psychiatric: She has a normal mood and affect. Her  behavior is normal.  Vitals reviewed.       Assessment & Plan:   Encounter Diagnoses  Name Primary?  Marland Kitchen Anemia, unspecified type Yes  . Cigarette nicotine dependence without complication   . Elevated BP without diagnosis of hypertension   . Rectal bleeding   . Hemorrhoids, unspecified hemorrhoid type   . Rectal abscess   . Rash of face      -Refer to surgeon for rectal abscess -rx keflex.  Recommended sitz baths -pt is given contact information for financial counselor (she thinks her [employer] is still active) - refer to dermatology for face rash that has been resistant to improvement -Check h/h -pt counseled on smoking cessation -Recheck bp 6 wk.  RTO sooner prn

## 2017-09-04 ENCOUNTER — Ambulatory Visit: Payer: Self-pay | Admitting: General Surgery

## 2017-09-04 ENCOUNTER — Encounter: Payer: Self-pay | Admitting: General Surgery

## 2017-09-04 ENCOUNTER — Other Ambulatory Visit (HOSPITAL_COMMUNITY)
Admission: RE | Admit: 2017-09-04 | Discharge: 2017-09-04 | Disposition: A | Discharge status: Home / Self Care | Payer: PRIVATE HEALTH INSURANCE | Source: Ambulatory Visit | Attending: Physician Assistant | Admitting: Physician Assistant

## 2017-09-04 ENCOUNTER — Ambulatory Visit (INDEPENDENT_AMBULATORY_CARE_PROVIDER_SITE_OTHER): Payer: Self-pay | Admitting: General Surgery

## 2017-09-04 VITALS — BP 143/99 | HR 100 | Temp 99.5°F | Resp 18 | Ht 67.0 in | Wt 118.0 lb

## 2017-09-04 DIAGNOSIS — K603 Anal fistula: Secondary | ICD-10-CM

## 2017-09-04 DIAGNOSIS — D649 Anemia, unspecified: Secondary | ICD-10-CM | POA: Insufficient documentation

## 2017-09-04 LAB — HEMOGLOBIN AND HEMATOCRIT, BLOOD
HEMATOCRIT: 36.9 % (ref 36.0–46.0)
Hemoglobin: 11.7 g/dL — ABNORMAL LOW (ref 12.0–15.0)

## 2017-09-04 NOTE — Patient Instructions (Signed)
Follow up with DR. Rehman for repeat Colonoscopy to evaluate for Crohn's.  Crohn Disease Crohn disease is a long-lasting (chronic) disease that affects your gastrointestinal (GI) tract. It often causes irritation and swelling (inflammation) in your small intestine and the beginning of your large intestine. However, it can affect any part of your GI tract. Crohn disease is part of a group of illnesses that are known as inflammatory bowel disease (IBD). Crohn disease may start slowly and get worse over time. Symptoms may come and go. They may also disappear for months or even years at a time (remission). What are the causes? The exact cause of Crohn disease is not known. It may be a response that causes your body's defense system (immune system) to mistakenly attack healthy cells and tissues (autoimmune response). Your genes and your environment may also play a role. What increases the risk? You may be at greater risk for Crohn disease if you:  Have other family members with Crohn disease or another IBD.  Use any tobacco products, including cigarettes, chewing tobacco, or electronic cigarettes.  Are in your 1s.  Have [place] European ancestry.  What are the signs or symptoms? The main signs and symptoms of Crohn disease involve your GI tract. These include:  Diarrhea.  Rectal bleeding.  An urgent need to move your bowels.  The feeling that you are not finished having a bowel movement.  Abdominal pain or cramping.  Constipation.  General signs and symptoms of Crohn disease may also include:  Unexplained weight loss.  Fatigue.  Fever.  Nausea.  Loss of appetite.  Joint pain  Changes in vision.  Red bumps on your skin.  How is this diagnosed? Your health care provider may suspect Crohn disease based on your symptoms and your medical history. Your health care provider will do a physical exam. You may need to see a health care provider who specializes in diseases of the  digestive tract (gastroenterologist). You may also have tests to help your health care providers make a diagnosis. These may include:  Blood tests.  Stool sample tests.  Imaging tests, such as X-rays and CT scans.  Tests to examine the inside of your intestines using a long, flexible tube that has a light and a camera on the end (endoscopy or colonoscopy).  A procedure to take tissue samples from inside your bowel (biopsy) to be examined under a microscope.  How is this treated? There is no cure for Crohn disease. Treatment will focus on managing your symptoms. Crohn disease affects each person differently. Your treatment may include:  Resting your bowels. Drinking only clear liquids or getting nutrition through an IV for a period of time gives your bowels a chance to heal because they are not passing stools.  Medicines. These may be used alone or in combination (combination therapy). These may include antibiotic medicines. You may be given medicines that help to: ? Reduce inflammation. ? Control your immune system activity. ? Fight infections. ? Relieve cramps and prevent diarrhea. ? Control your pain.  Surgery. You may need surgery if: ? Medicines and other treatments are no longer working. ? You develop complications from severe Crohn disease. ? A section of your intestine becomes so damaged that it needs to be removed.  Follow these instructions at home:  Take medicines only as directed by your health care provider.  If you were prescribed an antibiotic medicine, finish it all even if you start to feel better.  Keep all follow-up visits as  directed by your health care provider. This is important.  Talk with your health care provider about changing your diet. This may help your symptoms. Your health care provide may recommend changes, such as: ? Drinking more fluids. ? Avoiding milk and other foods that contain lactose. ? Eating a low-fat diet. ? Avoiding high-fiber foods,  such as popcorn and nuts. ? Avoiding carbonated beverages, such as soda. ? Eating smaller meals more often rather than eating large meals. ? Keeping a food diary to identify foods that make your symptoms better or worse.  Do not use any tobacco products, including cigarettes, chewing tobacco, or electronic cigarettes. If you need help quitting, ask your health care provider.  Limit alcohol intake to no more than 1 drink per day for nonpregnant women and 2 drinks per day for men. One drink equals 12 ounces of beer, 5 ounces of wine, or 1 ounces of hard liquor.  Exercise daily or as directed by your health care provider. Contact a health care provider if:  You have diarrhea, abdominal cramps, and other gastrointestinal problems that are present almost all of the time.  Your symptoms do not improve with treatment.  You continue to lose weight.  You develop a rash or sores on your skin.  You develop eye problems.  You have a fever.  Your symptoms get worse.  You develop new symptoms. Get help right away if:  You have bloody diarrhea.  You develop severe abdominal pain.  You cannot pass stools. This information is not intended to replace advice given to you by your health care provider. Make sure you discuss any questions you have with your health care provider. Document Released: 06/05/2005 Document Revised: 01/04/2016 Document Reviewed: 04/13/2014 Elsevier Interactive Patient Education  2018 [employer].

## 2017-09-07 NOTE — Progress Notes (Signed)
[facility] History and Physical  Reason for Referral: Perirectal abscess  Referring Physician: [facility]    Chief Complaint    Abscess      Sheila Berg is a 46 y.o. female.  HPI: Sheila Berg is a 46 yo who presents to my clinic with complaints of having recurrent perirectal abscess and drainage everyday from her anal area. She reports having multiple abscesses that develop and typically drain on their own, but she always has some discharge and wears a pad daily.  These abscesses have occurred on both sides of her anus. She says that she has a history of anemia and has had a IUD placed for her heavy periods but that she continue to have lower blood counts that cannot be completely explained.  She says that she tries to take iron twice a day but this makes her very constipated. When asked about bloody/ dark stools, she reports dark black stools from the iron, and some bleeding from what she presumes is her hemorrhoids. In 2016, she did undergo a colonoscopy that demonstrated a polyp that was removed and hemorrhoids. The colonoscopy report says the cecum was investigated but no report of the ileum being explored.    She has no prior history of any abdominal pain. She does have issues with chronic low weight and inability to gain any weight, weighing 118lb since she was a teenager.  She has no family history of IBD, Crohn's or UC.    Past Medical History:  Diagnosis Date  . Anemia   . Anxiety   . Boil 02/08/2016  . Contraceptive use education 07/17/2015  . Elevated BP 07/31/2015  . Hemorrhoids   . 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 Irregular intermenstrual bleeding 02/08/2016  . Migraine   . Nexplanon in place 02/08/2016  . Nexplanon insertion 07/31/2015   Inserted left arm 07/31/15   . Trichimoniasis 02/12/2016    Past Surgical History:  Procedure Laterality Date  . -    . COLONOSCOPY N/A 04/21/2015   Procedure: COLONOSCOPY;  Surgeon: Rogene Houston, MD;   Location: [facility];  Service: Endoscopy;  Laterality: N/A;  110 - moved to 1:20 - Ann to notify   . ESOPHAGOGASTRODUODENOSCOPY N/A 04/21/2015   Procedure: ESOPHAGOGASTRODUODENOSCOPY (EGD);  Surgeon: Rogene Houston, MD;  Location: [facility];  Service: Endoscopy;  Laterality: N/A;    Family History  Problem Relation Age of Onset  . Diabetes Mother   . Hypertension Mother   . Hypertension Father   . Breast cancer Maternal Aunt   . Diabetes Paternal Aunt   . Diabetes Maternal Grandmother   . Diabetes Paternal Aunt   . Stroke Maternal Aunt   . Kidney failure Maternal Aunt     Social History   Tobacco Use  . Smoking status: Current Every Day Smoker    Packs/day: 0.50    Years: 26.00    Pack years: 13.00    Types: Cigarettes  . Smokeless tobacco: Never Used  Substance Use Topics  . Alcohol use: Yes    Comment: sometimes  . Drug use: No    Medications: I have reviewed the patient's current medications. Allergies as of 09/04/2017   No Known Allergies     Medication List        Accurate as of 09/04/17 11:59 PM. Always use your most recent med list.          cephALEXin 500 MG capsule Commonly known as:  KEFLEX Take 1  capsule (500 mg total) by mouth 3 (three) times daily.   ferrous sulfate 325 (65 FE) MG tablet Take 325 mg by mouth 2 (two) times daily.   levonorgestrel 20 MCG/24HR IUD Commonly known as:  MIRENA 1 each by Intrauterine route once.        ROS:  A comprehensive review of systems was negative except for: Gastrointestinal: positive for perirectal drainage and abscess formation Hematologic/lymphatic: positive for anemia, chronically dry skin  Blood pressure (!) 143/99, pulse 100, temperature 99.5 F (37.5 C), resp. rate 18, height 5\' 7"  (1.702 m), weight 118 lb (53.5 kg). Physical Exam  Constitutional: She is oriented to person, place, and time. She appears distressed.  Thin but well developed, anxious   HENT:  Head: Normocephalic.    Eyes: Pupils are equal, round, and reactive to light.  Neck: Normal range of motion.  Cardiovascular: Normal rate and regular rhythm.  Pulmonary/Chest: Effort normal and breath sounds normal.  Abdominal: Soft. She exhibits no distension. There is no tenderness.  Genitourinary: Rectal exam shows tenderness.  Genitourinary Comments: Anus with multiple pitting areas circumferential around her anal orifice. Sequale of multiple prior abscesses, small skin tags posteriorly, internal exam painful, some thickening of the tissue posteriorly, no gross blood   Musculoskeletal: Normal range of motion.  Neurological: She is alert and oriented to person, place, and time.  Skin: Skin is warm and dry.  Psychiatric: Mood, memory, affect and judgment normal.  Vitals reviewed.   Results: CT 04/2017- Musculoskeletal: There is a 2.8 x 1.2 cm area of inflammatory changes and thickening with small fluid collection in the superficial soft tissues of the left gluteal region along the intergluteal cleft. A smaller fluid collection in the right gluteal superficial soft tissues measures 0.8 x 2.2 cm.  Assessment & Plan:  [name] is a 46 y.o. 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  Given her low weight, her chronic anemia with Hgb 9-11 even after her IUD placed and her external anal exam, I am very concerned that she could have Crohn's. Prior to any surgical intervention to correct these fistulas with seton/ fistulotomy, etc, I want her to speak with GI regarding the possibility of Crohn's and have a repeat colonoscopy to evaluate the ileum.  I have discussed with her the risk of performing surgical interventions on perianal Crohn's and the possibility of making things worse if this is Crohn's disease.  She is very anxious and tearful, but understanding that I did not want  to make her difficulty situation harder for her in the long run.  We discussed that if it is Crohn's that typically the medications are hopefully able to treat the perianal disease.  -Refer back to Dr. Laural Golden, he performed her colonoscopy 04/2015 for evaluation of the perianal disease to see his opinion and colonoscopy with plans for evaluation for suspicion of Crohn's  -I will see the patient back PRN if she is determined to not have Crohn's in order to discuss any surgical options moving forward   All questions were answered to the satisfaction of the patient.   Virl Cagey 09/07/2017, 9:43 AM

## 2017-09-16 ENCOUNTER — Telehealth (INDEPENDENT_AMBULATORY_CARE_PROVIDER_SITE_OTHER): Payer: Self-pay | Admitting: Internal Medicine

## 2017-09-16 NOTE — Telephone Encounter (Signed)
I called the patient and she was not there, I spoke with someone in the home and gave them the appointment day and time.

## 2017-09-23 ENCOUNTER — Encounter (INDEPENDENT_AMBULATORY_CARE_PROVIDER_SITE_OTHER): Payer: Self-pay | Admitting: *Deleted

## 2017-09-23 ENCOUNTER — Encounter (INDEPENDENT_AMBULATORY_CARE_PROVIDER_SITE_OTHER): Payer: Self-pay | Admitting: Internal Medicine

## 2017-09-23 ENCOUNTER — Ambulatory Visit (INDEPENDENT_AMBULATORY_CARE_PROVIDER_SITE_OTHER): Payer: PRIVATE HEALTH INSURANCE | Admitting: Internal Medicine

## 2017-09-23 ENCOUNTER — Other Ambulatory Visit (INDEPENDENT_AMBULATORY_CARE_PROVIDER_SITE_OTHER): Payer: Self-pay | Admitting: Internal Medicine

## 2017-09-23 VITALS — BP 120/90 | HR 68 | Temp 99.4°F | Resp 18 | Ht 67.0 in | Wt 120.1 lb

## 2017-09-23 DIAGNOSIS — K610 Anal abscess: Secondary | ICD-10-CM | POA: Insufficient documentation

## 2017-09-23 DIAGNOSIS — K603 Anal fistula: Secondary | ICD-10-CM

## 2017-09-23 MED ORDER — DOCUSATE SODIUM 100 MG PO CAPS: 100.0000 mg | ORAL_CAPSULE | Freq: Two times a day (BID) | ORAL | 0 refills | Status: DC

## 2017-09-23 NOTE — Patient Instructions (Addendum)
Colonoscopy to be scheduled. Hold iron for at least 1 week prior to colonoscopy. High-fiber diet.

## 2017-09-23 NOTE — Progress Notes (Addendum)
Reason for consultation;  Recurrent perianal abscesses. Concern for perianal Crohn's disease.  History of present illness.:  Patient is 47 year old [place] American female who is referred through the courtesy of Dr. Blake Divine for GI evaluation.  Patient was referred to Dr. Constance Haw by Ms. Daisy Blossom patient's primary health provider because of perianal abscesses.  Dr. Constance Haw is concerned that patient may have perianal Crohn's disease. Patient states that she has been developing perianal painful swellings for about 1 year.  Some of these lesions will drain spontaneously.  It is usually blood and scant amount of purulent material.  She has been treated with antibiotics on 3 different occasions but without any benefit.  She denies diarrhea.  She has chronic constipation.  She has tried stool softener and polyethylene glycol but without any success.  She has taken OTC laxatives in the past but not recently.  Her bowels move every 2-3 days.  She denies abdominal pain nausea or vomiting.  Her appetite is fair.  She states her weight always ranges between 118 and 125 pounds.  She has frequent headache and takes Aleve no more than 1 tablet daily.  She does not take it every day.  Patient states she was tested for STD at [facility] 2 weeks ago and tests were negative. Patient had colonoscopy along with a EGD in August 2016 when she presented with iron deficiency anemia and hematochezia.  She did not have perianal disease then.  Colonoscopy revealed a small tubular adenoma at hepatic flexure and external hemorrhoids.   Current Medications: Outpatient Encounter Medications as of 09/23/2017  Medication Sig  . desonide (DESOWEN) 0.05 % cream APPLY TO AFFECTED AREA UP TO TWICE A DAY AS NEEDED FOR SCALY PATCHES ON FACE  . ferrous sulfate 325 (65 FE) MG tablet Take 325 mg by mouth 2 (two) times daily.  Marland Kitchen levonorgestrel (MIRENA) 20 MCG/24HR IUD 1 each by Intrauterine route once.  .  naproxen sodium (ALEVE) 220 MG tablet Take 220 mg by mouth daily as needed (pain).  . [DISCONTINUED] FLUARIX QUADRIVALENT 0.5 ML injection ADM 0.5ML IM UTD  . docusate sodium (COLACE) 100 MG capsule Take 1 capsule (100 mg total) by mouth 2 (two) times daily.  . [DISCONTINUED] cephALEXin (KEFLEX) 500 MG capsule Take 1 capsule (500 mg total) by mouth 3 (three) times daily. (Patient not taking: Reported on 09/23/2017)   No facility-administered encounter medications on file as of 09/23/2017.    Past medical history:  Hospitalized in August 2016 for profound anemia with hemoglobin of 5.9 g.  She received 2 units of PRBCs.  EGD revealed erosive gastritis but H. pylori serology was negative. Colonoscopy revealed large external hemorrhoids and a small polyp was removed from hepatic flexure and turned out to be tubular adenoma.  History of frequent heavy menstrual bleeding felt to be source of patient's iron deficiency anemia.  She is responding to p.o. Iron. Patient has IUD which was placed in March 2018.  Chronic constipation.  History of migraine.  Treated for trichomoniasis in June 2017  History of migraine.   Allergies: No Known Allergies   Family history:  Father is 38 years old.  He has hypertension and in good health. Mother 49 years old.  She has osteoarthrosis and diabetes mellitus. She has 2 brothers ages 79 and 24 and a sister age 79 in good health.   Social history:  Patient is single.  She has daughter age 58 in good health.  Patient is sexually active.  She has 1 partner. She smokes half a pack of cigarettes per day.  She has been smoking for about 30 years.  She drinks alcohol socially but not daily and usually 1 drink a week.  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   Physical examination: Blood pressure 120/90, pulse 68, temperature 99.4 F (37.4 C), temperature source Oral, resp. rate 18, height 5\' 7"  (1.702 m), weight 120 lb 1.6 oz (54.5  kg). Patient  is alert and in no acute distress. Conjunctiva is pink. Sclera is nonicteric Oropharyngeal mucosa is normal. No neck masses or thyromegaly noted. Cardiac exam with regular rhythm normal S1 and S2. No murmur or gallop noted. Lungs are clear to auscultation. Abdomen is symmetrical.  She has a belly ring.  On palpation abdomen is soft and nontender without organomegaly or masses. Rectal examination was limited to external inspection.  She has induration to perianal skin with multiple open areas without any drainage.  She is tender over these indurated areas.  Soft anal skin tag noted. No LE edema or clubbing noted.  Labs/studies Results: CBC from 04/13/2017 WBC 7.1, H&H 9.9 and 31.5, MCV 75.2 and platelet count 400 K.  H&H 11.7 and 36.9 on 09/04/2017.  Assessment:  #1.  Patient is 47 year old African-American female who presents with 1 year history of perianal disease with abscesses and fistulae.  She has chronic constipation.  Last colonoscopy in August 2016 performed for hematochezia and iron deficiency anemia revealed no evidence of proctitis or other abnormalities.  I agree patient's symptomatology is worrisome for perianal Crohn's disease.  Therefore she needs to be evaluated with colonoscopy and if indeed she has endoscopic evidence of Crohn's disease will need MRI of pelvis to guide with optimal therapy of fistulous disease.  #2.  History of iron deficiency anemia felt to be due to excessive uterine blood loss.  Recent H&H close to normal.  #3.  Chronic constipation.  Review of patient's diet reveals that fiber intake is minimal.  She needs to increase intake of fiber rich foods as tolerated.  Recommendations:  Patient advised to keep NSAID use to minimum. Colace100 mg p.o. twice daily. Colonoscopy in the near future under monitored anesthesia care. Further recommendations to follow.

## 2017-09-24 NOTE — Patient Instructions (Signed)
Sheila Berg  09/24/2017     @PREFPERIOPPHARMACY @   Your procedure is scheduled on  09/29/2017 .  Report to [facility] at  615   A.M.  Call this number if you have problems the morning of surgery:  [phone]   Remember:  Do not eat food or drink liquids after midnight.  Take these medicines the morning of surgery with A SIP OF WATER None   Do not wear jewelry, make-up or nail polish.  Do not wear lotions, powders, or perfumes, or deodorant.  Do not shave 48 hours prior to surgery.  Men may shave face and neck.  Do not bring valuables to the hospital.  [facility] is not responsible for any belongings or valuables.  Contacts, dentures or bridgework may not be worn into surgery.  Leave your suitcase in the car.  After surgery it may be brought to your room.  For patients admitted to the hospital, discharge time will be determined by your treatment team.  Patients discharged the day of surgery will not be allowed to drive home.   Name and phone number of your driver:   family Special instructions:  Follow the diet and prep instructions given to you by Dr Olevia Perches office.  Please read over the following fact sheets that you were given. Anesthesia Post-op Instructions and Care and Recovery After Surgery       Colonoscopy, Adult A colonoscopy is an exam to look at the large intestine. It is done to check for problems, such as:  Lumps (tumors).  Growths (polyps).  Swelling (inflammation).  Bleeding.  What happens before the procedure? Eating and drinking Follow instructions from your doctor about eating and drinking. These instructions may include:  A few days before the procedure - follow a low-fiber diet. ? Avoid nuts. ? Avoid seeds. ? Avoid dried fruit. ? Avoid raw fruits. ? Avoid vegetables.  1-3 days before the procedure - follow a clear liquid diet. Avoid liquids that have red or purple dye. Drink only clear liquids, such as: ? Clear  broth or bouillon. ? Black coffee or tea. ? Clear juice. ? Clear soft drinks or sports drinks. ? Gelatin dessert. ? Popsicles.  On the day of the procedure - do not eat or drink anything during the 2 hours before the procedure.  Bowel prep If you were prescribed an oral bowel prep:  Take it as told by your doctor. Starting the day before your procedure, you will need to drink a lot of liquid. The liquid will cause you to poop (have bowel movements) until your poop is almost clear or light green.  If your skin or butt gets irritated from diarrhea, you may: ? Wipe the area with wipes that have medicine in them, such as adult wet wipes with aloe and vitamin E. ? Put something on your skin that soothes the area, such as petroleum jelly.  If you throw up (vomit) while drinking the bowel prep, take a break for up to 60 minutes. Then begin the bowel prep again. If you keep throwing up and you cannot take the bowel prep without throwing up, call your doctor.  General instructions  Ask your doctor about changing or stopping your normal medicines. This is important if you take diabetes medicines or blood thinners.  Plan to have someone take you home from the hospital or clinic. What happens during the procedure?  An IV tube may be put  into one of your veins.  You will be given medicine to help you relax (sedative).  To reduce your risk of infection: ? Your doctors will wash their hands. ? Your anal area will be washed with soap.  You will be asked to lie on your side with your knees bent.  Your doctor will get a long, thin, flexible tube ready. The tube will have a camera and a light on the end.  The tube will be put into your anus.  The tube will be gently put into your large intestine.  Air will be delivered into your large intestine to keep it open. You may feel some pressure or cramping.  The camera will be used to take photos.  A small tissue sample may be removed from your  body to be looked at under a microscope (biopsy). If any possible problems are found, the tissue will be sent to a lab for testing.  If small growths are found, your doctor may remove them and have them checked for cancer.  The tube that was put into your anus will be slowly removed. The procedure may vary among doctors and hospitals. What happens after the procedure?  Your doctor will check on you often until the medicines you were given have worn off.  Do not drive for 24 hours after the procedure.  You may have a small amount of blood in your poop.  You may pass gas.  You may have mild cramps or bloating in your belly (abdomen).  It is up to you to get the results of your procedure. Ask your doctor, or the department performing the procedure, when your results will be ready. This information is not intended to replace advice given to you by your health care provider. Make sure you discuss any questions you have with your health care provider. Document Released: 09/28/2010 Document Revised: 06/26/2016 Document Reviewed: 11/07/2015 Elsevier Interactive Patient Education  2017 [employer]  Colonoscopy, Adult, Care After This sheet gives you information about how to care for yourself after your procedure. Your health care provider may also give you more specific instructions. If you have problems or questions, contact your health care provider. What can I expect after the procedure? After the procedure, it is common to have:  A small amount of blood in your stool for 24 hours after the procedure.  Some gas.  Mild abdominal cramping or bloating.  Follow these instructions at home: General instructions   For the first 24 hours after the procedure: ? Do not drive or use machinery. ? Do not sign important documents. ? Do not drink alcohol. ? Do your regular daily activities at a slower pace than normal. ? Eat soft, easy-to-digest foods. ? Rest often.  Take over-the-counter  or prescription medicines only as told by your health care provider.  It is up to you to get the results of your procedure. Ask your health care provider, or the department performing the procedure, when your results will be ready. Relieving cramping and bloating  Try walking around when you have cramps or feel bloated.  Apply heat to your abdomen as told by your health care provider. Use a heat source that your health care provider recommends, such as a moist heat pack or a heating pad. ? Place a towel between your skin and the heat source. ? Leave the heat on for 20-30 minutes. ? Remove the heat if your skin turns bright red. This is especially important if you are unable  to feel pain, heat, or cold. You may have a greater risk of getting burned. Eating and drinking  Drink enough fluid to keep your urine clear or pale yellow.  Resume your normal diet as instructed by your health care provider. Avoid heavy or fried foods that are hard to digest.  Avoid drinking alcohol for as long as instructed by your health care provider. Contact a health care provider if:  You have blood in your stool 2-3 days after the procedure. Get help right away if:  You have more than a small spotting of blood in your stool.  You pass large blood clots in your stool.  Your abdomen is swollen.  You have nausea or vomiting.  You have a fever.  You have increasing abdominal pain that is not relieved with medicine. This information is not intended to replace advice given to you by your health care provider. Make sure you discuss any questions you have with your health care provider. Document Released: 04/09/2004 Document Revised: 05/20/2016 Document Reviewed: 11/07/2015 Elsevier Interactive Patient Education  2018 [place] Anesthesia is a term that refers to techniques, procedures, and medicines that help a person stay safe and comfortable during a medical procedure.  Monitored anesthesia care, or sedation, is one type of anesthesia. Your anesthesia specialist may recommend sedation if you will be having a procedure that does not require you to be unconscious, such as:  Cataract surgery.  A dental procedure.  A biopsy.  A colonoscopy.  During the procedure, you may receive a medicine to help you relax (sedative). There are three levels of sedation:  Mild sedation. At this level, you may feel awake and relaxed. You will be able to follow directions.  Moderate sedation. At this level, you will be sleepy. You may not remember the procedure.  Deep sedation. At this level, you will be asleep. You will not remember the procedure.  The more medicine you are given, the deeper your level of sedation will be. Depending on how you respond to the procedure, the anesthesia specialist may change your level of sedation or the type of anesthesia to fit your needs. An anesthesia specialist will monitor you closely during the procedure. Let your health care provider know about:  Any allergies you have.  All medicines you are taking, including vitamins, herbs, eye drops, creams, and over-the-counter medicines.  Any use of steroids (by mouth or as a cream).  Any problems you or family members have had with sedatives and anesthetic medicines.  Any blood disorders you have.  Any surgeries you have had.  Any medical conditions you have, such as sleep apnea.  Whether you are pregnant or may be pregnant.  Any use of cigarettes, alcohol, or street drugs. What are the risks? Generally, this is a safe procedure. However, problems may occur, including:  Getting too much medicine (oversedation).  Nausea.  Allergic reaction to medicines.  Trouble breathing. If this happens, a breathing tube may be used to help with breathing. It will be removed when you are awake and breathing on your own.  Heart trouble.  Lung trouble.  Before the procedure Staying  hydrated Follow instructions from your health care provider about hydration, which may include:  Up to 2 hours before the procedure - you may continue to drink clear liquids, such as water, clear fruit juice, black coffee, and plain tea.  Eating and drinking restrictions Follow instructions from your health care provider about eating and drinking, which may  include:  8 hours before the procedure - stop eating heavy meals or foods such as meat, fried foods, or fatty foods.  6 hours before the procedure - stop eating light meals or foods, such as toast or cereal.  6 hours before the procedure - stop drinking milk or drinks that contain milk.  2 hours before the procedure - stop drinking clear liquids.  Medicines Ask your health care provider about:  Changing or stopping your regular medicines. This is especially important if you are taking diabetes medicines or blood thinners.  Taking medicines such as aspirin and ibuprofen. These medicines can thin your blood. Do not take these medicines before your procedure if your health care provider instructs you not to.  Tests and exams  You will have a physical exam.  You may have blood tests done to show: ? How well your kidneys and liver are working. ? How well your blood can clot.  General instructions  Plan to have someone take you home from the hospital or clinic.  If you will be going home right after the procedure, plan to have someone with you for 24 hours.  What happens during the procedure?  Your blood pressure, heart rate, breathing, level of pain and overall condition will be monitored.  An IV tube will be inserted into one of your veins.  Your anesthesia specialist will give you medicines as needed to keep you comfortable during the procedure. This may mean changing the level of sedation.  The procedure will be performed. After the procedure  Your blood pressure, heart rate, breathing rate, and blood oxygen level  will be monitored until the medicines you were given have worn off.  Do not drive for 24 hours if you received a sedative.  You may: ? Feel sleepy, clumsy, or nauseous. ? Feel forgetful about what happened after the procedure. ? Have a sore throat if you had a breathing tube during the procedure. ? Vomit. This information is not intended to replace advice given to you by your health care provider. Make sure you discuss any questions you have with your health care provider. Document Released: 05/22/2005 Document Revised: 02/02/2016 Document Reviewed: 12/17/2015 Elsevier Interactive Patient Education  2018 [place], Care After These instructions provide you with information about caring for yourself after your procedure. Your health care provider may also give you more specific instructions. Your treatment has been planned according to current medical practices, but problems sometimes occur. Call your health care provider if you have any problems or questions after your procedure. What can I expect after the procedure? After your procedure, it is common to:  Feel sleepy for several hours.  Feel clumsy and have poor balance for several hours.  Feel forgetful about what happened after the procedure.  Have poor judgment for several hours.  Feel nauseous or vomit.  Have a sore throat if you had a breathing tube during the procedure.  Follow these instructions at home: For at least 24 hours after the procedure:   Do not: ? Participate in activities in which you could fall or become injured. ? Drive. ? Use heavy machinery. ? Drink alcohol. ? Take sleeping pills or medicines that cause drowsiness. ? Make important decisions or sign legal documents. ? Take care of children on your own.  Rest. Eating and drinking  Follow the diet that is recommended by your health care provider.  If you vomit, drink water, juice, or soup when you can drink without  vomiting.  Make sure you have little or no nausea before eating solid foods. General instructions  Have a responsible adult stay with you until you are awake and alert.  Take over-the-counter and prescription medicines only as told by your health care provider.  If you smoke, do not smoke without supervision.  Keep all follow-up visits as told by your health care provider. This is important. Contact a health care provider if:  You keep feeling nauseous or you keep vomiting.  You feel light-headed.  You develop a rash.  You have a fever. Get help right away if:  You have trouble breathing. This information is not intended to replace advice given to you by your health care provider. Make sure you discuss any questions you have with your health care provider. Document Released: 12/17/2015 Document Revised: 04/17/2016 Document Reviewed: 12/17/2015 Elsevier Interactive Patient Education  [employer].

## 2017-09-25 ENCOUNTER — Encounter (HOSPITAL_COMMUNITY)
Admission: RE | Admit: 2017-09-25 | Discharge: 2017-09-25 | Disposition: A | Discharge status: Home / Self Care | Payer: Self-pay | Source: Ambulatory Visit | Attending: Internal Medicine | Admitting: Internal Medicine

## 2017-09-25 ENCOUNTER — Other Ambulatory Visit: Payer: Self-pay

## 2017-09-25 ENCOUNTER — Encounter (HOSPITAL_COMMUNITY): Payer: Self-pay

## 2017-09-25 DIAGNOSIS — Z01812 Encounter for preprocedural laboratory examination: Secondary | ICD-10-CM | POA: Insufficient documentation

## 2017-09-25 LAB — BASIC METABOLIC PANEL
Anion gap: 7 (ref 5–15)
BUN: 10 mg/dL (ref 6–20)
CALCIUM: 8.8 mg/dL — AB (ref 8.9–10.3)
CO2: 24 mmol/L (ref 22–32)
CREATININE: 0.7 mg/dL (ref 0.44–1.00)
Chloride: 106 mmol/L (ref 101–111)
GFR calc non Af Amer: >60 mL/min (ref >60)
Glucose, Bld: 82 mg/dL (ref 65–99)
Potassium: 3.8 mmol/L (ref 3.5–5.1)
SODIUM: 137 mmol/L (ref 135–145)

## 2017-09-25 LAB — CBC
HEMATOCRIT: 36.9 % (ref 36.0–46.0)
Hemoglobin: 11.7 g/dL — ABNORMAL LOW (ref 12.0–15.0)
MCH: 26.2 pg (ref 26.0–34.0)
MCHC: 31.7 g/dL (ref 30.0–36.0)
MCV: 82.7 fL (ref 78.0–100.0)
Platelets: 366 10*3/uL (ref 150–400)
RBC: 4.46 MIL/uL (ref 3.87–5.11)
RDW: 17.2 % — AB (ref 11.5–15.5)
WBC: 5.8 10*3/uL (ref 4.0–10.5)

## 2017-09-25 LAB — HCG, QUANTITATIVE, PREGNANCY: hCG, Beta Chain, Quant, S: <1 m[IU]/mL (ref <5)

## 2017-09-29 ENCOUNTER — Ambulatory Visit (HOSPITAL_COMMUNITY): Payer: Self-pay | Admitting: Anesthesiology

## 2017-09-29 ENCOUNTER — Encounter (HOSPITAL_COMMUNITY)
Admission: RE | Disposition: A | Discharge status: Home / Self Care | Payer: Self-pay | Source: Ambulatory Visit | Attending: Internal Medicine

## 2017-09-29 ENCOUNTER — Ambulatory Visit (HOSPITAL_COMMUNITY)
Admission: RE | Admit: 2017-09-29 | Discharge: 2017-09-29 | Disposition: A | Discharge status: Home / Self Care | Payer: Self-pay | Source: Ambulatory Visit | Attending: Internal Medicine | Admitting: Internal Medicine

## 2017-09-29 ENCOUNTER — Other Ambulatory Visit: Payer: Self-pay

## 2017-09-29 ENCOUNTER — Encounter (HOSPITAL_COMMUNITY): Payer: Self-pay

## 2017-09-29 DIAGNOSIS — Z8249 Family history of ischemic heart disease and other diseases of the circulatory system: Secondary | ICD-10-CM | POA: Insufficient documentation

## 2017-09-29 DIAGNOSIS — Z833 Family history of diabetes mellitus: Secondary | ICD-10-CM | POA: Insufficient documentation

## 2017-09-29 DIAGNOSIS — Z803 Family history of malignant neoplasm of breast: Secondary | ICD-10-CM | POA: Insufficient documentation

## 2017-09-29 DIAGNOSIS — Z823 Family history of stroke: Secondary | ICD-10-CM | POA: Insufficient documentation

## 2017-09-29 DIAGNOSIS — K603 Anal fistula: Secondary | ICD-10-CM | POA: Insufficient documentation

## 2017-09-29 DIAGNOSIS — K644 Residual hemorrhoidal skin tags: Secondary | ICD-10-CM | POA: Insufficient documentation

## 2017-09-29 DIAGNOSIS — K610 Anal abscess: Secondary | ICD-10-CM | POA: Insufficient documentation

## 2017-09-29 DIAGNOSIS — F17210 Nicotine dependence, cigarettes, uncomplicated: Secondary | ICD-10-CM | POA: Insufficient documentation

## 2017-09-29 DIAGNOSIS — Z841 Family history of disorders of kidney and ureter: Secondary | ICD-10-CM | POA: Insufficient documentation

## 2017-09-29 HISTORY — PX: COLONOSCOPY WITH PROPOFOL: SHX5780

## 2017-09-29 SURGERY — COLONOSCOPY WITH PROPOFOL
Anesthesia: Monitor Anesthesia Care

## 2017-09-29 MED ORDER — FENTANYL CITRATE (PF) 100 MCG/2ML IJ SOLN
25.0000 ug | Freq: Once | INTRAMUSCULAR | Status: AC
  Administered 2017-09-29: 25 ug via INTRAVENOUS

## 2017-09-29 MED ORDER — PROPOFOL 500 MG/50ML IV EMUL: INTRAVENOUS | Status: DC | PRN

## 2017-09-29 MED ORDER — CHLORHEXIDINE GLUCONATE CLOTH 2 % EX PADS: 6.0000 | MEDICATED_PAD | Freq: Once | CUTANEOUS | Status: DC

## 2017-09-29 MED ORDER — LACTATED RINGERS IV SOLN
INTRAVENOUS | Status: DC
  Administered 2017-09-29: 07:00:00 via INTRAVENOUS

## 2017-09-29 MED ORDER — MIDAZOLAM HCL 2 MG/2ML IJ SOLN
INTRAMUSCULAR | Status: AC
  Filled 2017-09-29: qty 2

## 2017-09-29 MED ORDER — MIDAZOLAM HCL 2 MG/2ML IJ SOLN
1.0000 mg | INTRAMUSCULAR | Status: AC
  Administered 2017-09-29: 2 mg via INTRAVENOUS

## 2017-09-29 MED ORDER — PROPOFOL 500 MG/50ML IV EMUL
INTRAVENOUS | Status: DC | PRN
  Administered 2017-09-29: 100 ug/kg/min via INTRAVENOUS
  Administered 2017-09-29: 110 ug/kg/min via INTRAVENOUS

## 2017-09-29 MED ORDER — PROPOFOL 10 MG/ML IV BOLUS
INTRAVENOUS | Status: AC
  Filled 2017-09-29: qty 60

## 2017-09-29 MED ORDER — PROPOFOL 10 MG/ML IV BOLUS
INTRAVENOUS | Status: DC | PRN
  Administered 2017-09-29 (×5): 20 mg via INTRAVENOUS

## 2017-09-29 MED ORDER — HYDROCORTISONE 2.5 % RE CREA: 1.0000 "application " | TOPICAL_CREAM | Freq: Two times a day (BID) | RECTAL | 0 refills | Status: DC

## 2017-09-29 MED ORDER — FENTANYL CITRATE (PF) 100 MCG/2ML IJ SOLN
INTRAMUSCULAR | Status: AC
  Filled 2017-09-29: qty 2

## 2017-09-29 NOTE — Anesthesia Procedure Notes (Signed)
Procedure Name: MAC Date/Time: 09/29/2017 7:28 AM Performed by: Vista Deck, CRNA Pre-anesthesia Checklist: Patient identified, Emergency Drugs available, Suction available, Timeout performed and Patient being monitored Patient Re-evaluated:Patient Re-evaluated prior to induction Oxygen Delivery Method: Non-rebreather mask

## 2017-09-29 NOTE — H&P (Signed)
Sheila Berg is an 47 y.o. female.   Chief Complaint: Patient is here for colonoscopy. 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  She has not experienced diarrhea.  She has been constipated.  She has not experienced melena or rectal bleeding. She is undergoing diagnostic colonoscopy. Please see my consultation note from 09/23/2017 for details of her history.  Past Medical History:  Diagnosis Date  . IDA   . Perianal fistulae. 02/08/2016  . Contraceptive use education 07/17/2015  .  07/31/2015  . Hemorrhoids   . 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 Irregular intermenstrual bleeding 02/08/2016  . Migraine   . Nexplanon in place 02/08/2016  . Nexplanon insertion 07/31/2015   Inserted left arm 07/31/15   . Trichimoniasis; history of.  Has been treated. 02/12/2016    Past Surgical History:  Procedure Laterality Date  . -    . COLONOSCOPY N/A 04/21/2015   Procedure: COLONOSCOPY;  Surgeon: Rogene Houston, MD;  Location: [facility];  Service: Endoscopy;  Laterality: N/A;  110 - moved to 1:20 - Ann to notify   . ESOPHAGOGASTRODUODENOSCOPY N/A 04/21/2015   Procedure: ESOPHAGOGASTRODUODENOSCOPY (EGD);  Surgeon: Rogene Houston, MD;  Location: [facility];  Service: Endoscopy;  Laterality: N/A;    Family History  Problem Relation Age of Onset  . Diabetes Mother   . Hypertension Mother   . Hypertension Father   . Breast cancer Maternal Aunt   . Diabetes Paternal Aunt   . Diabetes Maternal Grandmother   . Diabetes Paternal Aunt   . Stroke Maternal Aunt   . Kidney failure Maternal Aunt    Social History:  reports that she has been smoking cigarettes.  She has a 13.00 pack-year smoking history. she has never used smokeless tobacco. She reports that she drinks alcohol. She reports that she does  not use drugs.  Allergies: No Known Allergies  Medications Prior to Admission  Medication Sig Dispense Refill  . desonide (DESOWEN) 0.05 % cream APPLY TO AFFECTED AREA UP TO TWICE A DAY AS NEEDED FOR SCALY PATCHES ON FACE  3  . docusate sodium (COLACE) 100 MG capsule Take 1 capsule (100 mg total) by mouth 2 (two) times daily. 10 capsule 0  . ferrous sulfate 325 (65 FE) MG tablet Take 325 mg by mouth 2 (two) times daily.    Marland Kitchen levonorgestrel (MIRENA) 20 MCG/24HR IUD 1 each by Intrauterine route once.    . naproxen sodium (ALEVE) 220 MG tablet Take 220 mg by mouth daily as needed (pain).      No results found for this or any previous visit (from the past 48 hour(s)). No results found.  ROS  Blood pressure 115/85, pulse 76, temperature 98.5 F (36.9 C), temperature source Oral, resp. rate 20, last menstrual period 09/16/2017, SpO2 100 %. Physical Exam  Constitutional:  Well-developed thin African-American female in NAD.  HENT:  Mouth/Throat: Oropharynx is clear and moist.  Eyes: Conjunctivae are normal. No scleral icterus.  Neck: No thyromegaly present.  Cardiovascular: Normal rate, regular rhythm and normal heart sounds.  No murmur heard. Respiratory: Effort normal and breath sounds normal.  GI:  Abdomen is flat soft and nontender without organomegaly or masses.  Musculoskeletal: She exhibits no edema.  Lymphadenopathy:    She has no cervical adenopathy.  Neurological: She is alert.  Skin: Skin is warm and dry.     Assessment/Plan Perianal fistulae. Diagnostic colonoscopy looking for Crohn's disease.  Hildred Laser, MD 09/29/2017, 7:21 AM

## 2017-09-29 NOTE — Anesthesia Preprocedure Evaluation (Signed)
Anesthesia Evaluation  Patient identified by MRN, date of birth, ID band Patient awake    Reviewed: Allergy & Precautions, NPO status , Patient's Chart, lab work & pertinent test results  Airway Mallampati: I  TM Distance: >3 FB Neck ROM: Full    Dental  (+) Teeth Intact   Pulmonary Current Smoker,    breath sounds clear to auscultation       Cardiovascular negative cardio ROS   Rhythm:Regular Rate:Normal     Neuro/Psych  Headaches,    GI/Hepatic   Endo/Other    Renal/GU   Female genitourinary complaint:        Musculoskeletal   Abdominal   Peds  Hematology  (+) anemia ,   Anesthesia Other Findings Perianal abscess   GI bleed due to NSAIDs  Reproductive/Obstetrics                             Anesthesia Physical Anesthesia Plan  ASA: II  Anesthesia Plan: MAC   Post-op Pain Management:    Induction: Intravenous  PONV Risk Score and Plan:   Airway Management Planned: Simple Face Mask  Additional Equipment:   Intra-op Plan:   Post-operative Plan:   Informed Consent: I have reviewed the patients History and Physical, chart, labs and discussed the procedure including the risks, benefits and alternatives for the proposed anesthesia with the patient or authorized representative who has indicated his/her understanding and acceptance.     Plan Discussed with:   Anesthesia Plan Comments:         Anesthesia Quick Evaluation

## 2017-09-29 NOTE — Transfer of Care (Signed)
Immediate Anesthesia Transfer of Care Note  Patient: Sheila Berg  Procedure(s) Performed: COLONOSCOPY WITH PROPOFOL (N/A )  Patient Location: PACU  Anesthesia Type:MAC  Level of Consciousness: awake, alert  and patient cooperative  Airway & Oxygen Therapy: Patient Spontanous Breathing  Post-op Assessment: Report given to RN and Post -op Vital signs reviewed and stable  Post vital signs: Reviewed and stable  Last Vitals:  Vitals:   09/29/17 0715 09/29/17 0720  BP: 121/86 124/85  Pulse:    Resp: 13 15  Temp:    SpO2: 100% 100%    Last Pain:  Vitals:   09/29/17 0644  TempSrc: Oral         Complications: No apparent anesthesia complications

## 2017-09-29 NOTE — Discharge Instructions (Signed)
Resume usual medications including ferrous sulfate as before. ProctoCream-HC 2.5% to be applied to the anal canal twice daily for 1 week and thereafter on as-needed basis. Resume usual diet. No driving for 24 hours. Follow-up with Dr. Constance Haw.   Colonoscopy, Adult, Care After This sheet gives you information about how to care for yourself after your procedure. Your doctor may also give you more specific instructions. If you have problems or questions, call your doctor. Follow these instructions at home: General instructions   For the first 24 hours after the procedure: ? Do not drive or use machinery. ? Do not sign important documents. ? Do not drink alcohol. ? Do your daily activities more slowly than normal. ? Eat foods that are soft and easy to digest. ? Rest often.  Take over-the-counter or prescription medicines only as told by your doctor.  It is up to you to get the results of your procedure. Ask your doctor, or the department performing the procedure, when your results will be ready. To help cramping and bloating:  Try walking around.  Put heat on your belly (abdomen) as told by your doctor. Use a heat source that your doctor recommends, such as a moist heat pack or a heating pad. ? Put a towel between your skin and the heat source. ? Leave the heat on for 20-30 minutes. ? Remove the heat if your skin turns bright red. This is especially important if you cannot feel pain, heat, or cold. You can get burned. Eating and drinking  Drink enough fluid to keep your pee (urine) clear or pale yellow.  Return to your normal diet as told by your doctor. Avoid heavy or fried foods that are hard to digest.  Avoid drinking alcohol for as long as told by your doctor. Contact a doctor if:  You have blood in your poop (stool) 2-3 days after the procedure. Get help right away if:  You have more than a small amount of blood in your poop.  You see large clumps of tissue (blood  clots) in your poop.  Your belly is swollen.  You feel sick to your stomach (nauseous).  You throw up (vomit).  You have a fever.  You have belly pain that gets worse, and medicine does not help your pain. This information is not intended to replace advice given to you by your health care provider. Make sure you discuss any questions you have with your health care provider. Document Released: 09/28/2010 Document Revised: 05/20/2016 Document Reviewed: 05/20/2016 Elsevier Interactive Patient Education  2017 [employer].

## 2017-09-29 NOTE — Op Note (Signed)
[facility] Patient Name: Sheila Berg Procedure Date: 09/29/2017 7:24 AM MRN: [id number] Date of Birth: 1970-12-09 Attending MD: Hildred Laser , MD CSN: [id number] Age: 47 Admit Type: Outpatient Procedure:                Colonoscopy Indications:              Fistula in ano Providers:                Hildred Laser, MD, Lurline Del, RN, Nelma Rothman,                            Technician Referring MD:             Curlene Labrum, MD Medicines:                Propofol per Anesthesia Complications:            No immediate complications. Estimated Blood Loss:     Estimated blood loss: none. Procedure:                Pre-Anesthesia Assessment:                           - Prior to the procedure, a History and Physical                            was performed, and patient medications and                            allergies were reviewed. The patient's tolerance of                            previous anesthesia was also reviewed. The risks                            and benefits of the procedure and the sedation                            options and risks were discussed with the patient.                            All questions were answered, and informed consent                            was obtained. Prior Anticoagulants: The patient                            last took naproxen 7 days prior to the procedure.                            ASA Grade Assessment: II - A patient with mild                            systemic disease. After reviewing the risks and  benefits, the patient was deemed in satisfactory                            condition to undergo the procedure.                           After obtaining informed consent, the colonoscope                            was passed under direct vision. Throughout the                            procedure, the patient's blood pressure, pulse, and                            oxygen saturations were monitored  continuously. The                            [id number] ([id number]) scope was introduced through                            the anus and advanced to the the terminal ileum,                            with identification of the appendiceal orifice and                            IC valve. The colonoscopy was technically difficult                            and complex due to significant looping. Successful                            completion of the procedure was aided by                            withdrawing and reinserting the scope. The terminal                            ileum, ileocecal valve, appendiceal orifice, and                            rectum were photographed. The quality of the bowel                            preparation was good. The terminal ileum, ileocecal                            valve, appendiceal orifice, and rectum were                            photographed. Scope In: 7:36:10 AM Scope Out: 7:59:20 AM Scope Withdrawal Time: 0 hours 6 minutes 23 seconds  Total Procedure Duration: 0 hours 23 minutes  10 seconds  Findings:      The perianal exam findings include perianal fistula.      The digital rectal exam was normal.      The terminal ileum appeared normal.      The colon (entire examined portion) appeared normal.      External hemorrhoids were found during retroflexion. The hemorrhoids       were medium-sized. 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                           - The examined portion of the ileum was normal.                           - The entire examined colon is normal.                           - External hemorrhoids.                           - No specimens collected. Moderate Sedation:      Per Anesthesia Care Recommendation:           - Patient has a contact number available for                            emergencies. The signs and symptoms of potential                            delayed complications were  discussed with the                            patient. Return to normal activities tomorrow.                            Written discharge instructions were provided to the                            patient.                           - Resume previous diet today.                           - Continue present medications.                           - ProctoCream HC 2.5% be applied to anal canal                            twice daily for 1 week and thereafter as needed.                           - Follow-up with Dr. Constance Haw.                           -  Repeat colonoscopy in 10 years for screening                            purposes. 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 All rights reserved. The codes documented in this report are preliminary and upon coder review may  be revised to meet current compliance requirements. Hildred Laser, MD Hildred Laser, MD 09/29/2017 8:11:04 AM This report has been signed electronically. Number of Addenda: 0

## 2017-09-29 NOTE — Anesthesia Postprocedure Evaluation (Signed)
Anesthesia Post Note  Patient: Sheila Berg  Procedure(s) Performed: COLONOSCOPY WITH PROPOFOL (N/A )  Patient location during evaluation: PACU Anesthesia Type: MAC Level of consciousness: awake and alert and patient cooperative Pain management: satisfactory to patient Vital Signs Assessment: post-procedure vital signs reviewed and stable Respiratory status: spontaneous breathing Cardiovascular status: stable Postop Assessment: no apparent nausea or vomiting Anesthetic complications: no     Last Vitals:  Vitals:   09/29/17 0810 09/29/17 0815  BP: 107/87 114/89  Pulse: 72 69  Resp: (!) 27 (!) 23  Temp: 36.5 C   SpO2: 100% 100%    Last Pain:  Vitals:   09/29/17 0644  TempSrc: Oral                 Antonisha Waskey

## 2017-09-30 ENCOUNTER — Encounter (HOSPITAL_COMMUNITY): Payer: Self-pay | Admitting: Internal Medicine

## 2017-10-08 ENCOUNTER — Ambulatory Visit: Payer: Self-pay | Admitting: Physician Assistant

## 2017-10-08 ENCOUNTER — Encounter: Payer: Self-pay | Admitting: Physician Assistant

## 2017-10-08 VITALS — BP 124/84 | HR 94 | Temp 98.1°F | Ht 67.0 in | Wt 119.0 lb

## 2017-10-08 DIAGNOSIS — K649 Unspecified hemorrhoids: Secondary | ICD-10-CM

## 2017-10-08 DIAGNOSIS — D649 Anemia, unspecified: Secondary | ICD-10-CM

## 2017-10-08 NOTE — Progress Notes (Signed)
BP 124/84 (BP Location: Right Arm, Patient Position: Sitting, Cuff Size: Normal)   Pulse 94   Temp 98.1 F (36.7 C)   Ht 5\' 7"  (1.702 m)   Wt 119 lb (54 kg)   LMP 09/16/2017   SpO2 100%   BMI 18.64 kg/m    Subjective:    Patient ID: Sheila Berg, female    DOB: 1970-12-05, 47 y.o.   MRN: [id number]  HPI: Sheila Berg is a 47 y.o. female presenting on 10/08/2017 for Hypertension   HPI   Pt has seen surgeon for hemorrhoid/fistula.  The surgeon referred her to GI for colonoscopy to r/o Crohn's.  Reviewed colonoscopy report.  Pt BP if great today.   Relevant past medical, surgical, family and social history reviewed and updated as indicated. Interim medical history since our last visit reviewed. Allergies and medications reviewed and updated.   Current Outpatient Medications:  .  desonide (DESOWEN) 0.05 % cream, APPLY TO AFFECTED AREA UP TO TWICE A DAY AS NEEDED FOR SCALY PATCHES ON FACE, Disp: , Rfl: 3 .  ferrous sulfate 325 (65 FE) MG tablet, Take 325 mg by mouth 2 (two) times daily., Disp: , Rfl:  .  levonorgestrel (MIRENA) 20 MCG/24HR IUD, 1 each by Intrauterine route once., Disp: , Rfl:  .  naproxen sodium (ALEVE) 220 MG tablet, Take 220 mg by mouth daily as needed (pain)., Disp: , Rfl:  .  docusate sodium (COLACE) 100 MG capsule, Take 1 capsule (100 mg total) by mouth 2 (two) times daily. (Patient not taking: Reported on 10/08/2017), Disp: 10 capsule, Rfl: 0 .  hydrocortisone (ANUSOL-HC) 2.5 % rectal cream, Place 1 application rectally 2 (two) times daily. (Patient not taking: Reported on 10/08/2017), Disp: 30 g, Rfl: 0   Review of Systems  Constitutional: Negative for appetite change, chills, diaphoresis, fatigue, fever and unexpected weight change.  HENT: Negative for congestion, dental problem, drooling, ear pain, facial swelling, hearing loss, mouth sores, sneezing, sore throat, trouble swallowing and voice change.   Eyes: Negative for pain, discharge, redness,  itching and visual disturbance.  Respiratory: Negative for cough, choking, shortness of breath and wheezing.   Cardiovascular: Negative for chest pain, palpitations and leg swelling.  Gastrointestinal: Positive for constipation. Negative for abdominal pain, blood in stool, diarrhea and vomiting.  Endocrine: Positive for cold intolerance. Negative for heat intolerance and polydipsia.  Genitourinary: Negative for decreased urine volume, dysuria and hematuria.  Musculoskeletal: Positive for back pain. Negative for arthralgias and gait problem.  Skin: Positive for rash.  Allergic/Immunologic: Negative for environmental allergies.  Neurological: Negative for seizures, syncope, light-headedness and headaches.  Hematological: Negative for adenopathy.  Psychiatric/Behavioral: Negative for agitation, dysphoric mood and suicidal ideas. The patient is not nervous/anxious.     Per HPI unless specifically indicated above     Objective:    BP 124/84 (BP Location: Right Arm, Patient Position: Sitting, Cuff Size: Normal)   Pulse 94   Temp 98.1 F (36.7 C)   Ht 5\' 7"  (1.702 m)   Wt 119 lb (54 kg)   LMP 09/16/2017   SpO2 100%   BMI 18.64 kg/m   Wt Readings from Last 3 Encounters:  10/08/17 119 lb (54 kg)  09/23/17 120 lb 1.6 oz (54.5 kg)  09/04/17 118 lb (53.5 kg)    Physical Exam  Constitutional: She is oriented to person, place, and time. She appears well-developed and well-nourished.  HENT:  Head: Normocephalic and atraumatic.  Neck: Neck supple.  Cardiovascular: Normal  rate and regular rhythm.  Pulmonary/Chest: Effort normal and breath sounds normal.  Abdominal: Soft. Bowel sounds are normal. She exhibits no mass. There is no hepatosplenomegaly. There is no tenderness.  Musculoskeletal: She exhibits no edema.  Lymphadenopathy:    She has no cervical adenopathy.  Neurological: She is alert and oriented to person, place, and time.  Skin: Skin is warm and dry.  Psychiatric: She has a  normal mood and affect. Her behavior is normal.  Vitals reviewed.   Results for orders placed or performed during the hospital encounter of 09/25/17  CBC  Result Value Ref Range   WBC 5.8 4.0 - 10.5 K/uL   RBC 4.46 3.87 - 5.11 MIL/uL   Hemoglobin 11.7 (L) 12.0 - 15.0 g/dL   HCT 36.9 36.0 - 46.0 %   MCV 82.7 78.0 - 100.0 fL   MCH 26.2 26.0 - 34.0 pg   MCHC 31.7 30.0 - 36.0 g/dL   RDW 17.2 (H) 11.5 - 15.5 %   Platelets 366 150 - 400 K/uL  Basic metabolic panel  Result Value Ref Range   Sodium 137 135 - 145 mmol/L   Potassium 3.8 3.5 - 5.1 mmol/L   Chloride 106 101 - 111 mmol/L   CO2 24 22 - 32 mmol/L   Glucose, Bld 82 65 - 99 mg/dL   BUN 10 6 - 20 mg/dL   Creatinine, Ser 0.70 0.44 - 1.00 mg/dL   Calcium 8.8 (L) 8.9 - 10.3 mg/dL   GFR calc non Af Amer >60 >60 mL/min   GFR calc Af Amer >60 >60 mL/min   Anion gap 7 5 - 15  hCG, quantitative, pregnancy  Result Value Ref Range   hCG, Beta Chain, Quant, S <1 <5 mIU/mL      Assessment & Plan:   Encounter Diagnoses  Name Primary?  Marland Kitchen Anemia, unspecified type Yes  . Hemorrhoids, unspecified hemorrhoid type      -pt to follow up with surgeon for her hemorrhoid/fistula -continue iron and iron rich diet for her anemia -pt to follow up here in 4 months.  RTO sooner prn

## 2018-02-04 ENCOUNTER — Ambulatory Visit: Payer: Self-pay | Admitting: Physician Assistant

## 2018-05-19 ENCOUNTER — Telehealth (HOSPITAL_COMMUNITY): Payer: Self-pay | Admitting: *Deleted

## 2018-05-19 NOTE — Telephone Encounter (Signed)
Telephoned patient at home number and left message to return call to [facility] 

## 2018-05-25 ENCOUNTER — Other Ambulatory Visit (HOSPITAL_COMMUNITY): Payer: Self-pay | Admitting: *Deleted

## 2018-05-25 DIAGNOSIS — N6320 Unspecified lump in the left breast, unspecified quadrant: Secondary | ICD-10-CM

## 2018-08-11 ENCOUNTER — Ambulatory Visit (HOSPITAL_COMMUNITY): Payer: PRIVATE HEALTH INSURANCE

## 2018-08-11 ENCOUNTER — Other Ambulatory Visit: Payer: PRIVATE HEALTH INSURANCE

## 2019-05-05 ENCOUNTER — Telehealth: Payer: Self-pay | Admitting: Medical

## 2019-05-05 NOTE — Telephone Encounter (Signed)

## 2019-05-06 ENCOUNTER — Other Ambulatory Visit: Payer: Self-pay

## 2019-05-06 ENCOUNTER — Ambulatory Visit (INDEPENDENT_AMBULATORY_CARE_PROVIDER_SITE_OTHER): Payer: PRIVATE HEALTH INSURANCE | Admitting: Medical

## 2019-05-06 ENCOUNTER — Other Ambulatory Visit (HOSPITAL_COMMUNITY): Payer: Self-pay | Admitting: Medical

## 2019-05-06 ENCOUNTER — Encounter: Payer: Self-pay | Admitting: Medical

## 2019-05-06 ENCOUNTER — Other Ambulatory Visit: Payer: Self-pay | Admitting: Medical

## 2019-05-06 VITALS — BP 144/96 | HR 81 | Ht 68.0 in | Wt 125.0 lb

## 2019-05-06 DIAGNOSIS — N6310 Unspecified lump in the right breast, unspecified quadrant: Secondary | ICD-10-CM

## 2019-05-06 NOTE — Patient Instructions (Addendum)
Breast Cyst  A breast cyst is a sac in the breast that is filled with fluid. Breast cysts are usually noncancerous (benign). They are common among women, and they are most often located in the upper, outer portion of the breast. One or more cysts may develop. They form when fluid builds up inside of the breast glands. There are several types of breast cysts:  Macrocyst. This is a cyst that is about 2 inches (5.1 cm) across (in diameter).  Microcyst. This is a very small cyst that you cannot feel, but it can be seen with imaging tests such as an X-ray of the breast (mammogram) or ultrasound.  Galactocele. This is a cyst that contains milk. It may develop if you suddenly stop breastfeeding. Breast cysts do not increase your risk of breast cancer. They usually disappear after menopause, unless you take artificial hormones (are on hormone therapy). What are the causes? The exact cause of breast cysts is not known. Possible causes include:  Blockage of tubes (ducts) in the breast glands, which leads to fluid buildup. Duct blockage may result from: ? Fibrocystic breast changes. This is a common, benign condition that occurs when women go through hormonal changes during the menstrual cycle. This is a common cause of multiple breast cysts. ? Overgrowth of breast tissue or breast glands. ? Scar tissue in the breast from previous surgery.  Changes in certain female hormones (estrogen and progesterone). What increases the risk? You may be more likely to develop breast cysts if you have not gone through menopause. What are the signs or symptoms? Symptoms of a breast cyst may include:  Feeling one or more smooth, round, soft lumps (like grapes) in the breast that are easily moveable. The lump(s) may get bigger and more painful before your period and get smaller after your period.  Breast discomfort or pain. How is this diagnosed? A cyst can be felt during a physical exam by your health care provider.  A mammogram and ultrasound will be done to confirm the diagnosis. Fluid may be removed from the cyst with a needle (fine-needle aspiration) and tested to make sure the cyst is not cancerous. How is this treated? Treatment may not be necessary. Your health care provider may monitor the cyst to see if it goes away on its own. If the cyst is uncomfortable or gets bigger, or if you do not like how the cyst makes your breast look, you may need treatment. Treatment may include:  Hormone treatment.  Fine-needle aspiration, to drain fluid from the cyst. There is a chance of the cyst coming back (recurring) after aspiration.  Surgery to remove the cyst. Follow these instructions at home:  See your health care provider regularly. ? Get a yearly physical exam. ? If you are 36-49 years old, get a clinical breast exam every 1-3 years. After age 47, get this exam every year. ? Get mammograms as often as directed.  Do a breast self-exam every month, or as often as directed. Having many breast cysts, or "lumpy" breasts, may make it harder to feel for new lumps. Understand how your breasts normally look and feel, and write down any changes in your breasts so you can tell your health care provider about the changes. A breast self-exam involves: ? Comparing your breasts in the mirror. ? Looking for visible changes in your skin or nipples. ? Feeling for lumps or changes.  Take over-the-counter and prescription medicines only as told by your health care provider.  Wear  a supportive bra, especially when exercising.  Follow instructions from your health care provider about eating and drinking restrictions. ? Avoid caffeine. ? Cut down on salt (sodium) in what you eat and drink, especially before your menstrual period. Too much sodium can cause fluid buildup (retention), breast swelling, and discomfort.  Keep all follow-up visits as told your health care provider. This is important. Contact a health care  provider if:  You feel, or think you feel, a lump in your breast.  You notice that both breasts look or feel different than usual.  Your breast is still causing pain after your menstrual period is over.  You find new lumps or bumps that were not there before.  You feel lumps in your armpit (axilla). Get help right away if:  You have severe pain, tenderness, redness, or warmth in your breast.  You have fluid or blood leaking from your nipple.  Your breast lump becomes hard and painful.  You notice dimpling or wrinkling of the breast or nipple. This information is not intended to replace advice given to you by your health care provider. Make sure you discuss any questions you have with your health care provider. Document Released: 08/26/2005 Document Revised: 08/08/2017 Document Reviewed: 05/17/2016 Elsevier Patient Education  2020 [employer] A mammogram is an X-ray of the breasts that is done to check for changes that are not normal. This test can screen for and find any changes that may suggest breast cancer. Mammograms are regularly done on women. A man may have a mammogram if he has a lump or swelling in his breast. This test can also help to find other changes and variations in the breast. Tell a doctor:  About any allergies you have.  If you have breast implants.  If you have had previous breast disease, biopsy, or surgery.  If you are breastfeeding.  If you are younger than age 76.  If you have a family history of breast cancer.  Whether you are pregnant or may be pregnant. What are the risks? Generally, this is a safe procedure. However, problems may occur, including:  Exposure to radiation. Radiation levels are very low with this test.  The results being misinterpreted.  The need for further tests.  The inability of the mammogram to detect certain cancers. What happens before the procedure?  Have this test done about 1-2 weeks after your  period. This is usually when your breasts are the least tender.  If you are visiting a new doctor or clinic, send any past mammogram images to your new doctor's office.  Wash your breasts and under your arms the day of the test.  Do not use deodorants, perfumes, lotions, or powders on the day of the test.  Take off any jewelry from your neck.  Wear clothes that you can change into and out of easily. What happens during the procedure?   You will undress from the waist up. You will put on a gown.  You will stand in front of the X-ray machine.  Each breast will be placed between two plastic or glass plates. The plates will press down on your breast for a few seconds. Try to stay as relaxed as possible. This does not cause any harm to your breasts. Any discomfort you feel will be very brief.  X-rays will be taken from different angles of each breast. The procedure may vary among doctors and hospitals. What happens after the procedure?  The mammogram will be read  by a specialist (radiologist).  You may need to do certain parts of the test again. This depends on the quality of the images.  Ask when your test results will be ready. Make sure you get your test results.  You may go back to your normal activities. Summary  A mammogram is a low energy X-ray of the breasts that is done to check for abnormal changes. A man may have this test if he has a lump or swelling in his breast.  Before the procedure, tell your doctor about any breast problems that you have had in the past.  Have this test done about 1-2 weeks after your period.  For the test, each breast will be placed between two plastic or glass plates. The plates will press down on your breast for a few seconds.  The mammogram will be read by a specialist (radiologist). Ask when your test results will be ready. Make sure you get your test results. This information is not intended to replace advice given to you by your health  care provider. Make sure you discuss any questions you have with your health care provider. Document Released: 11/22/2008 Document Revised: 04/16/2018 Document Reviewed: 04/16/2018 Elsevier Patient Education  2020 [employer].

## 2019-05-06 NOTE — Progress Notes (Signed)
History:  [name] is a 48 y.o. [id number] who presents to clinic today for evaluated of a breast mass first noted on Monday. She states that the mass is tender. She has had breast cysts in the past, most recently 2018. She denies any erythema, drainage, nipple discharge or fever.    The following portions of the patient's history were reviewed and updated as appropriate: allergies, current medications, family history, past medical history, social history, past surgical history and problem list.  Review of Systems:  Review of Systems  Constitutional: Negative for fever.  Genitourinary:       + breast mass + breast tenderness       Objective:  Physical Exam BP (!) 144/96 (BP Location: Right Arm, Patient Position: Sitting, Cuff Size: Normal)   Pulse 81   Ht 5\' 8"  (1.727 m)   Wt 125 lb (56.7 kg)   LMP 05/03/2019   BMI 19.01 kg/m  Physical Exam  Nursing note and vitals reviewed. Constitutional: She is oriented to person, place, and time. She appears well-developed and well-nourished. No distress.  HENT:  Head: Normocephalic and atraumatic.  Cardiovascular: Normal rate.  Respiratory: Effort normal. Right breast exhibits mass (2 cm mobile mass noted at 4:00) and tenderness. Right breast exhibits no inverted nipple, no nipple discharge and no skin change. Left breast exhibits no inverted nipple, no mass, no nipple discharge, no skin change and no tenderness. Breasts are symmetrical.  GI: Soft.  Neurological: She is alert and oriented to person, place, and time.  Skin: Skin is warm and dry. No erythema.  Psychiatric: She has a normal mood and affect.    Assessment & Plan:  1. Breast mass, right - MM Digital Diagnostic Unilat R; Future - Patient to return to [facility] PRN   Danielle Swinson 05/06/2019 12:34 PM

## 2019-05-18 ENCOUNTER — Other Ambulatory Visit: Payer: Self-pay

## 2019-05-18 ENCOUNTER — Ambulatory Visit (HOSPITAL_COMMUNITY): Payer: PRIVATE HEALTH INSURANCE

## 2019-05-18 ENCOUNTER — Inpatient Hospital Stay (HOSPITAL_COMMUNITY): Admission: RE | Admit: 2019-05-18 | Payer: PRIVATE HEALTH INSURANCE | Source: Ambulatory Visit

## 2019-05-18 ENCOUNTER — Ambulatory Visit (HOSPITAL_COMMUNITY)
Admission: RE | Admit: 2019-05-18 | Discharge: 2019-05-18 | Disposition: A | Discharge status: Home / Self Care | Payer: PRIVATE HEALTH INSURANCE | Source: Ambulatory Visit | Attending: Medical | Admitting: Medical

## 2019-05-18 DIAGNOSIS — N6310 Unspecified lump in the right breast, unspecified quadrant: Secondary | ICD-10-CM | POA: Insufficient documentation

## 2019-11-04 ENCOUNTER — Ambulatory Visit: Payer: PRIVATE HEALTH INSURANCE

## 2019-11-11 ENCOUNTER — Other Ambulatory Visit: Payer: Self-pay | Admitting: Family Medicine

## 2019-11-11 ENCOUNTER — Encounter: Payer: Self-pay | Admitting: Family Medicine

## 2019-11-11 ENCOUNTER — Ambulatory Visit: Payer: PRIVATE HEALTH INSURANCE | Admitting: Family Medicine

## 2019-11-11 VITALS — BP 138/88 | HR 86 | Ht 68.0 in | Wt 123.0 lb

## 2019-11-11 DIAGNOSIS — H029 Unspecified disorder of eyelid: Secondary | ICD-10-CM

## 2019-11-11 DIAGNOSIS — Z789 Other specified health status: Secondary | ICD-10-CM

## 2019-11-11 NOTE — Patient Instructions (Signed)
Steps to Quit Smoking Smoking tobacco is the leading cause of preventable death. It can affect almost every organ in the body. Smoking puts you and people around you at risk for many serious, long-lasting (chronic) diseases. Quitting smoking can be hard, but it is one of the best things that you can do for your health. It is never too late to quit. How do I get ready to quit? When you decide to quit smoking, make a plan to help you succeed. Before you quit:  Pick a date to quit. Set a date within the next 2 weeks to give you time to prepare.  Write down the reasons why you are quitting. Keep this list in places where you will see it often.  Tell your family, friends, and co-workers that you are quitting. Their support is important.  Talk with your doctor about the choices that may help you quit.  Find out if your health insurance will pay for these treatments.  Know the people, places, things, and activities that make you want to smoke (triggers). Avoid them. What first steps can I take to quit smoking?  Throw away all cigarettes at home, at work, and in your car.  Throw away the things that you use when you smoke, such as ashtrays and lighters.  Clean your car. Make sure to empty the ashtray.  Clean your home, including curtains and carpets. What can I do to help me quit smoking? Talk with your doctor about taking medicines and seeing a counselor at the same time. You are more likely to succeed when you do both.  If you are pregnant or breastfeeding, talk with your doctor about counseling or other ways to quit smoking. Do not take medicine to help you quit smoking unless your doctor tells you to do so. To quit smoking: Quit right away  Quit smoking totally, instead of slowly cutting back on how much you smoke over a period of time.  Go to counseling. You are more likely to quit if you go to counseling sessions regularly. Take medicine You may take medicines to help you quit. Some  medicines need a prescription, and some you can buy over-the-counter. Some medicines may contain a drug called nicotine to replace the nicotine in cigarettes. Medicines may:  Help you to stop having the desire to smoke (cravings).  Help to stop the problems that come when you stop smoking (withdrawal symptoms). Your doctor may ask you to use:  Nicotine patches, gum, or lozenges.  Nicotine inhalers or sprays.  Non-nicotine medicine that is taken by mouth. Find resources Find resources and other ways to help you quit smoking and remain smoke-free after you quit. These resources are most helpful when you use them often. They include:  Online chats with a counselor.  Phone quitlines.  Printed self-help materials.  Support groups or group counseling.  Text messaging programs.  Mobile phone apps. Use apps on your mobile phone or tablet that can help you stick to your quit plan. There are many free apps for mobile phones and tablets as well as websites. Examples include Quit Guide from the CDC and smokefree.gov  What things can I do to make it easier to quit?   Talk to your family and friends. Ask them to support and encourage you.  Call a phone quitline (1-800-QUIT-NOW), reach out to support groups, or work with a counselor.  Ask people who smoke to not smoke around you.  Avoid places that make you want to smoke,   such as: ? Bars. ? Parties. ? Smoke-break areas at work.  Spend time with people who do not smoke.  Lower the stress in your life. Stress can make you want to smoke. Try these things to help your stress: ? Getting regular exercise. ? Doing deep-breathing exercises. ? Doing yoga. ? Meditating. ? Doing a body scan. To do this, close your eyes, focus on one area of your body at a time from head to toe. Notice which parts of your body are tense. Try to relax the muscles in those areas. How will I feel when I quit smoking? Day 1 to 3 weeks Within the first 24 hours,  you may start to have some problems that come from quitting tobacco. These problems are very bad 2-3 days after you quit, but they do not often last for more than 2-3 weeks. You may get these symptoms:  Mood swings.  Feeling restless, nervous, angry, or annoyed.  Trouble concentrating.  Dizziness.  Strong desire for high-sugar foods and nicotine.  Weight gain.  Trouble pooping (constipation).  Feeling like you may vomit (nausea).  Coughing or a sore throat.  Changes in how the medicines that you take for other issues work in your body.  Depression.  Trouble sleeping (insomnia). Week 3 and afterward After the first 2-3 weeks of quitting, you may start to notice more positive results, such as:  Better sense of smell and taste.  Less coughing and sore throat.  Slower heart rate.  Lower blood pressure.  Clearer skin.  Better breathing.  Fewer sick days. Quitting smoking can be hard. Do not give up if you fail the first time. Some people need to try a few times before they succeed. Do your best to stick to your quit plan, and talk with your doctor if you have any questions or concerns. Summary  Smoking tobacco is the leading cause of preventable death. Quitting smoking can be hard, but it is one of the best things that you can do for your health.  When you decide to quit smoking, make a plan to help you succeed.  Quit smoking right away, not slowly over a period of time.  When you start quitting, seek help from your doctor, family, or friends. This information is not intended to replace advice given to you by your health care provider. Make sure you discuss any questions you have with your health care provider. Document Revised: 05/21/2019 Document Reviewed: 11/14/2018 Elsevier Patient Education  2020 Elsevier Inc.  

## 2019-11-11 NOTE — Progress Notes (Signed)
Subjective:     Patient ID: Sheila Berg, female   DOB: 09-Jan-1971, 49 y.o.   MRN: [id number]  HPI Sheila Berg [facility] the [facility] today for her required wellness visit for her insurance. Her PCP is Soyla Dryer, [place], but she has not seen her in a long time. States it has been several years since she's had any blood work done. She reports her mammogram was done last year in August, states she always has to get an ultrasound done due to dense breast tissue. Her pap smear is UTD per pt. She reports a hx of iron deficiency anemia, takes an iron supplement when she remembers. States has had to get blood transfusions in the past, no active bleeding was found through endo/colonoscopy. She denies any other problems, no other medications. She reports she does not exercise, and eats a lot but not necessarily healthy all the time. She smokes, and is not interested in quitting at this time but would like more information about it.   She reports a small "bump" to the upper eyelid near the lash line of her right eye. States it has been there for many years. Denies any pain, redness or discharge. States she can just feel it there and it's bothersome. She reports noticing increased blurred vision in the right eye over the last six months. She has never had an eye exam.   Past Medical History:  Diagnosis Date  . Anemia   . Boil 02/08/2016  . Contraceptive use education 07/17/2015  . Elevated BP 07/31/2015  . Hemorrhoids   . 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 Irregular intermenstrual bleeding 02/08/2016  . Migraine   . Nexplanon in place 02/08/2016  . Nexplanon insertion 07/31/2015   Inserted left arm 07/31/15   . Trichimoniasis 02/12/2016   No Known Allergies  Current Outpatient Medications:  .  ferrous sulfate 325 (65 FE) MG tablet, Take 325 mg by mouth. , Disp: , Rfl:    Review of Systems  Constitutional: Negative for chills, fatigue, fever and unexpected weight change.  HENT:  Negative for congestion, ear pain, sinus pressure, sinus pain and sore throat.   Eyes: Negative for pain, discharge, redness and itching.  Respiratory: Negative for cough, shortness of breath and wheezing.   Cardiovascular: Negative for chest pain and leg swelling.  Gastrointestinal: Negative for abdominal pain, blood in stool, constipation, diarrhea, nausea and vomiting.  Genitourinary: Negative for difficulty urinating and hematuria.  Skin: Negative for color change.  Neurological: Negative for dizziness, weakness, light-headedness and headaches.  Hematological: Negative for adenopathy.  All other systems reviewed and are negative.      Objective:   Physical Exam Vitals and nursing note reviewed.  Constitutional:      General: She is not in acute distress.    Appearance: She is well-developed.  HENT:     Head: Normocephalic and atraumatic.  Eyes:     General:        Right eye: No foreign body, discharge or hordeolum.        Left eye: No discharge.     Extraocular Movements: Extraocular movements intact.     Conjunctiva/sclera: Conjunctivae normal.     Pupils: Pupils are equal, round, and reactive to light.     Comments: Small bump noted to right upper lash line, non-tender, no redness or discharge noted.   Cardiovascular:     Rate and Rhythm: Normal rate and regular rhythm.     Heart sounds: Normal heart sounds.  No murmur.  Pulmonary:     Effort: Pulmonary effort is normal. No respiratory distress.     Breath sounds: Normal breath sounds.  Musculoskeletal:     Cervical back: Neck supple.  Skin:    General: Skin is warm and dry.  Neurological:     Mental Status: She is alert and oriented to person, place, and time.        Assessment:     Participant in health and wellness plan  Eyelid problem      Plan:     1. Recommend annual physical with PCP. Will draw screening labs, check lipid panel, cmp, and cbc with her hx of anemia. Encouraged healthy diet and  exercise. Smoking cessation discussed, handout given.  2. Recommend scheduling an appt with an eye doctor for a full exam to further evaluate her eyelid and blurry vision.  3. F/u here prn.

## 2019-11-12 LAB — COMPLETE METABOLIC PANEL WITH GFR
AG Ratio: 1.7 (calc) (ref 1.0–2.5)
ALT: 12 U/L (ref 6–29)
AST: 13 U/L (ref 10–35)
Albumin: 4 g/dL (ref 3.6–5.1)
Alkaline phosphatase (APISO): 68 U/L (ref 31–125)
BUN: 11 mg/dL (ref 7–25)
CO2: 23 mmol/L (ref 20–32)
Calcium: 9 mg/dL (ref 8.6–10.2)
Chloride: 106 mmol/L (ref 98–110)
Creat: 0.79 mg/dL (ref 0.50–1.10)
GFR, Est African American: 103 mL/min/{1.73_m2} (ref >=60)
GFR, Est Non African American: 89 mL/min/{1.73_m2} (ref >=60)
Globulin: 2.4 g/dL (calc) (ref 1.9–3.7)
Glucose, Bld: 88 mg/dL (ref 65–139)
Potassium: 3.8 mmol/L (ref 3.5–5.3)
Sodium: 140 mmol/L (ref 135–146)
Total Bilirubin: 0.5 mg/dL (ref 0.2–1.2)
Total Protein: 6.4 g/dL (ref 6.1–8.1)

## 2019-11-12 LAB — LIPID PANEL
Cholesterol: 138 mg/dL (ref <200)
HDL: 59 mg/dL (ref >=50)
LDL Cholesterol (Calc): 63 mg/dL (calc)
Non-HDL Cholesterol (Calc): 79 mg/dL (calc) (ref <130)
Total CHOL/HDL Ratio: 2.3 (calc) (ref <5.0)
Triglycerides: 80 mg/dL (ref <150)

## 2019-11-12 LAB — CBC
HCT: 38.1 % (ref 35.0–45.0)
Hemoglobin: 12.1 g/dL (ref 11.7–15.5)
MCH: 27.6 pg (ref 27.0–33.0)
MCHC: 31.8 g/dL — ABNORMAL LOW (ref 32.0–36.0)
MCV: 87 fL (ref 80.0–100.0)
MPV: 11.1 fL (ref 7.5–12.5)
Platelets: 270 10*3/uL (ref 140–400)
RBC: 4.38 10*6/uL (ref 3.80–5.10)
RDW: 13.9 % (ref 11.0–15.0)
WBC: 6.8 10*3/uL (ref 3.8–10.8)

## 2020-12-23 ENCOUNTER — Ambulatory Visit
Admission: EM | Admit: 2020-12-23 | Discharge: 2020-12-23 | Disposition: A | Discharge status: Home / Self Care | Payer: PRIVATE HEALTH INSURANCE | Attending: Emergency Medicine | Admitting: Emergency Medicine

## 2020-12-23 ENCOUNTER — Encounter: Payer: Self-pay | Admitting: Emergency Medicine

## 2020-12-23 ENCOUNTER — Other Ambulatory Visit: Payer: Self-pay

## 2020-12-23 DIAGNOSIS — S161XXA Strain of muscle, fascia and tendon at neck level, initial encounter: Secondary | ICD-10-CM

## 2020-12-23 DIAGNOSIS — M542 Cervicalgia: Secondary | ICD-10-CM | POA: Diagnosis not present

## 2020-12-23 MED ORDER — CYCLOBENZAPRINE HCL 10 MG PO TABS: 10.0000 mg | ORAL_TABLET | Freq: Every day | ORAL | 0 refills | Status: DC

## 2020-12-23 MED ORDER — PREDNISONE 10 MG (21) PO TBPK: ORAL_TABLET | Freq: Every day | ORAL | 0 refills | Status: DC

## 2020-12-23 NOTE — Discharge Instructions (Signed)
Continue conservative management of rest, ice, heat, and gentle stretches Prednisone prescribed.  Take as directed and to completion Take cyclobenzaprine at nighttime for symptomatic relief. Avoid driving or operating heavy machinery while using medication. Follow up with PCP if symptoms persist Return or go to the ER if you have any new or worsening symptoms (fever, chills, chest pain, redness, swelling, bruising, etc...)

## 2020-12-23 NOTE — ED Provider Notes (Signed)
[place]   [id number] 12/23/20 Arrival Time: 2993  ZJ:[facility] PAIN  SUBJECTIVE: History from: patient. Sheila Berg is a 50 y.o. female complains of RT sided neck pain x 4 days.  Denies a precipitating event or specific injury.  Localizes the pain to the RT side of neck.  Describes the pain as constant and burning/ pulling in character.  Has tried OTC medications without relief.  Symptoms are made worse with ROM.  Denies similar symptoms in the past.  Denies fever, chills, erythema, ecchymosis, effusion, weakness, numbness and tingling.  ROS: As per HPI.  All other pertinent ROS negative.     Past Medical History:  Diagnosis Date  . Anemia   . Boil 02/08/2016  . Contraceptive use education 07/17/2015  . Elevated BP 07/31/2015  . Hemorrhoids   . 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 Irregular intermenstrual bleeding 02/08/2016  . Migraine   . Nexplanon in place 02/08/2016  . Nexplanon insertion 07/31/2015   Inserted left arm 07/31/15   . Trichimoniasis 02/12/2016   Past Surgical History:  Procedure Laterality Date  . -    . COLONOSCOPY N/A 04/21/2015   Procedure: COLONOSCOPY;  Surgeon: Rogene Houston, MD;  [facility];  Service: Endoscopy;  Laterality: N/A;  110 - moved to 1:20 - Ann to notify   . COLONOSCOPY WITH PROPOFOL N/A 09/29/2017   Procedure: COLONOSCOPY WITH PROPOFOL;  Surgeon: Rogene Houston, MD;  Location: [facility];  Service: Endoscopy;  Laterality: N/A;  7:30  . ESOPHAGOGASTRODUODENOSCOPY N/A 04/21/2015   Procedure: ESOPHAGOGASTRODUODENOSCOPY (EGD);  Surgeon: Rogene Houston, MD;  Location: [facility];  Service: Endoscopy;  Laterality: N/A;   No Known Allergies No current facility-administered medications on file prior to encounter.   Current Outpatient Medications on File Prior to Encounter  Medication Sig Dispense Refill  . ferrous sulfate 325 (65 FE) MG tablet Take 325 mg by mouth.      Social History   Socioeconomic History   . Marital status: Single    Spouse name: Not on file  . Number of children: Not on file  . Years of education: Not on file  . Highest education level: Not on file  Occupational History  . Not on file  Tobacco Use  . Smoking status: Current Every Day Smoker    Packs/day: 0.50    Years: 26.00    Pack years: 13.00    Types: Cigarettes  . Smokeless tobacco: Never Used  Vaping Use  . Vaping Use: Never used  Substance and Sexual Activity  . Alcohol use: Yes    Comment: sometimes  . Drug use: No    Types: Marijuana  . Sexual activity: Not Currently    Birth control/protection: None  Other Topics Concern  . Not on file  Social History Narrative  . Not on file   Social Determinants of Health   Financial Resource Strain: Not on file  Food Insecurity: Not on file  Transportation Needs: Not on file  Physical Activity: Not on file  Stress: Not on file  Social Connections: Not on file  Intimate Partner Violence: Not on file   Family History  Problem Relation Age of Onset  . Diabetes Mother   . Hypertension Mother   . Hypertension Father   . Breast cancer Maternal Aunt   . Diabetes Paternal Aunt   . Diabetes Maternal Grandmother   . Diabetes Paternal Aunt   . Stroke Maternal Aunt   . Kidney  failure Maternal Aunt     OBJECTIVE:  Vitals:   12/23/20 1122  BP: (!) 149/104  Pulse: 81  Resp: 18  Temp: 98.1 F (36.7 C)  TempSrc: Oral  SpO2: 99%    General appearance: ALERT; in no acute distress.  Head: NCAT Lungs: Normal respiratory effort Musculoskeletal: NECK Inspection: Skin warm, dry, clear and intact without obvious erythema, effusion, or ecchymosis.  Palpation: TTP over RT side of neck and superior trapezius ROM: LROM  Strength: 5/5 shld abduction, 5/5 shld adduction, 5/5 elbow flexion, 5/5 elbow extension, 5/5 grip strength Skin: warm and dry Neurologic: Ambulates without difficulty; Sensation intact about the upper/ lower extremities Psychological: alert  and cooperative; normal mood and affect   ASSESSMENT & PLAN:  1. Neck pain   2. Strain of cervical portion of right trapezius muscle      Meds ordered this encounter  Medications  . predniSONE (STERAPRED UNI-PAK 21 TAB) 10 MG (21) TBPK tablet    Sig: Take by mouth daily. Take 6 tabs by mouth daily  for 2 days, then 5 tabs for 2 days, then 4 tabs for 2 days, then 3 tabs for 2 days, 2 tabs for 2 days, then 1 tab by mouth daily for 2 days    Dispense:  42 tablet    Refill:  0    Order Specific Question:   Supervising Provider    Answer:   Raylene Everts [[id number]]  . cyclobenzaprine (FLEXERIL) 10 MG tablet    Sig: Take 1 tablet (10 mg total) by mouth at bedtime.    Dispense:  15 tablet    Refill:  0    Order Specific Question:   Supervising Provider    Answer:   Raylene Everts [[id number]]    Continue conservative management of rest, ice, heat, and gentle stretches Prednisone prescribed.  Take as directed and to completion Take cyclobenzaprine at nighttime for symptomatic relief. Avoid driving or operating heavy machinery while using medication. Follow up with PCP if symptoms persist Return or go to the ER if you have any new or worsening symptoms (fever, chills, chest pain, redness, swelling, bruising, etc...)   Reviewed expectations re: course of current medical issues. Questions answered. Outlined signs and symptoms indicating need for more acute intervention. Patient verbalized understanding. After Visit Summary given.    Lestine Box, PA-C 12/23/20 1154

## 2020-12-23 NOTE — ED Triage Notes (Signed)
Right sided neck pain,  Unable to turn head to right side.  Burning and pulling sensation since Tuesday.

## 2020-12-28 ENCOUNTER — Ambulatory Visit: Payer: PRIVATE HEALTH INSURANCE

## 2021-01-04 ENCOUNTER — Ambulatory Visit: Payer: PRIVATE HEALTH INSURANCE | Admitting: Family Medicine

## 2021-01-04 ENCOUNTER — Encounter: Payer: Self-pay | Admitting: Family Medicine

## 2021-01-04 VITALS — BP 118/86 | HR 70 | Ht 68.0 in | Wt 121.5 lb

## 2021-01-04 DIAGNOSIS — Z789 Other specified health status: Secondary | ICD-10-CM

## 2021-01-04 NOTE — Progress Notes (Signed)
Subjective:     Patient ID: Sheila Berg, female   DOB: 1971-07-22, 50 y.o.   MRN: [id number]  HPI  Sheila Berg presents to the [facility] wellness clinic today for her required wellness visit for her insurance. She does not have a PCP.  She does see an OBGYN for her pap smears, which is UTD. Needs f/u mammogram scheduled. Colonoscopy UTD. Covid-19 vaccine and booster done. She reports doing well with diet and exercise. She denies any current problems or concerns. She thinks about quitting smoking but is not ready at this time.   Past Medical History:  Diagnosis Date  . Anemia   . Boil 02/08/2016  . Contraceptive use education 07/17/2015  . Elevated BP 07/31/2015  . Hemorrhoids   . 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 Irregular intermenstrual bleeding 02/08/2016  . Migraine   . Nexplanon in place 02/08/2016  . Nexplanon insertion 07/31/2015   Inserted left arm 07/31/15   . Trichimoniasis 02/12/2016   No Known Allergies  Current Outpatient Medications:  .  ferrous sulfate 325 (65 FE) MG tablet, Take 325 mg by mouth. , Disp: , Rfl:  .  cyclobenzaprine (FLEXERIL) 10 MG tablet, Take 1 tablet (10 mg total) by mouth at bedtime. (Patient not taking: Reported on 01/04/2021), Disp: 15 tablet, Rfl: 0 .  predniSONE (STERAPRED UNI-PAK 21 TAB) 10 MG (21) TBPK tablet, Take by mouth daily. Take 6 tabs by mouth daily  for 2 days, then 5 tabs for 2 days, then 4 tabs for 2 days, then 3 tabs for 2 days, 2 tabs for 2 days, then 1 tab by mouth daily for 2 days (Patient not taking: Reported on 01/04/2021), Disp: 42 tablet, Rfl: 0   Review of Systems  Constitutional: Negative for chills, fatigue, fever and unexpected weight change.  HENT: Negative for congestion, ear pain, sinus pressure, sinus pain and sore throat.   Eyes: Negative for discharge and visual disturbance.  Respiratory: Negative for cough, shortness of breath and wheezing.   Cardiovascular: Negative for chest pain and leg swelling.   Gastrointestinal: Negative for abdominal pain, blood in stool, constipation, diarrhea, nausea and vomiting.  Genitourinary: Negative for difficulty urinating and hematuria.  Skin: Negative for color change.  Neurological: Negative for dizziness, weakness, light-headedness and headaches.  Hematological: Negative for adenopathy.  All other systems reviewed and are negative.      Objective:   Physical Exam Vitals reviewed.  Constitutional:      General: She is not in acute distress.    Appearance: Normal appearance. She is well-developed.  HENT:     Head: Normocephalic and atraumatic.  Eyes:     General:        Right eye: No discharge.        Left eye: No discharge.  Cardiovascular:     Rate and Rhythm: Normal rate and regular rhythm.     Heart sounds: Normal heart sounds.  Pulmonary:     Effort: Pulmonary effort is normal. No respiratory distress.     Breath sounds: Normal breath sounds.  Musculoskeletal:     Cervical back: Neck supple.  Skin:    General: Skin is warm and dry.  Neurological:     Mental Status: She is alert and oriented to person, place, and time.  Psychiatric:        Mood and Affect: Mood normal.        Behavior: Behavior normal.    Today's Vitals   01/04/21 1412  BP: 118/86  Pulse: 70  SpO2: 98%  Weight: 121 lb 8 oz (55.1 kg)  Height: 5\' 8"  (1.727 m)   Body mass index is 18.47 kg/m.      Assessment:     Participant in health and wellness plan      Plan:     1. Strongly encouraged pt to get established with a PCP.  Smoking cessation discussed. F/u here prn

## 2021-03-24 IMAGING — MG MM DIGITAL DIAGNOSTIC BILAT W/ TOMO W/ CAD
4 of 9 series · 4 of 29 positions shown · non-contrast
Comparison: Previous exam(s).

CLINICAL DATA: 47-year-old patient with tenderness and lumpiness in
the upper-outer quadrant right breast.

EXAM:
DIGITAL DIAGNOSTIC BILATERAL MAMMOGRAM WITH CAD AND TOMO
ULTRASOUND RIGHT BREAST

[R CC synth-2D]
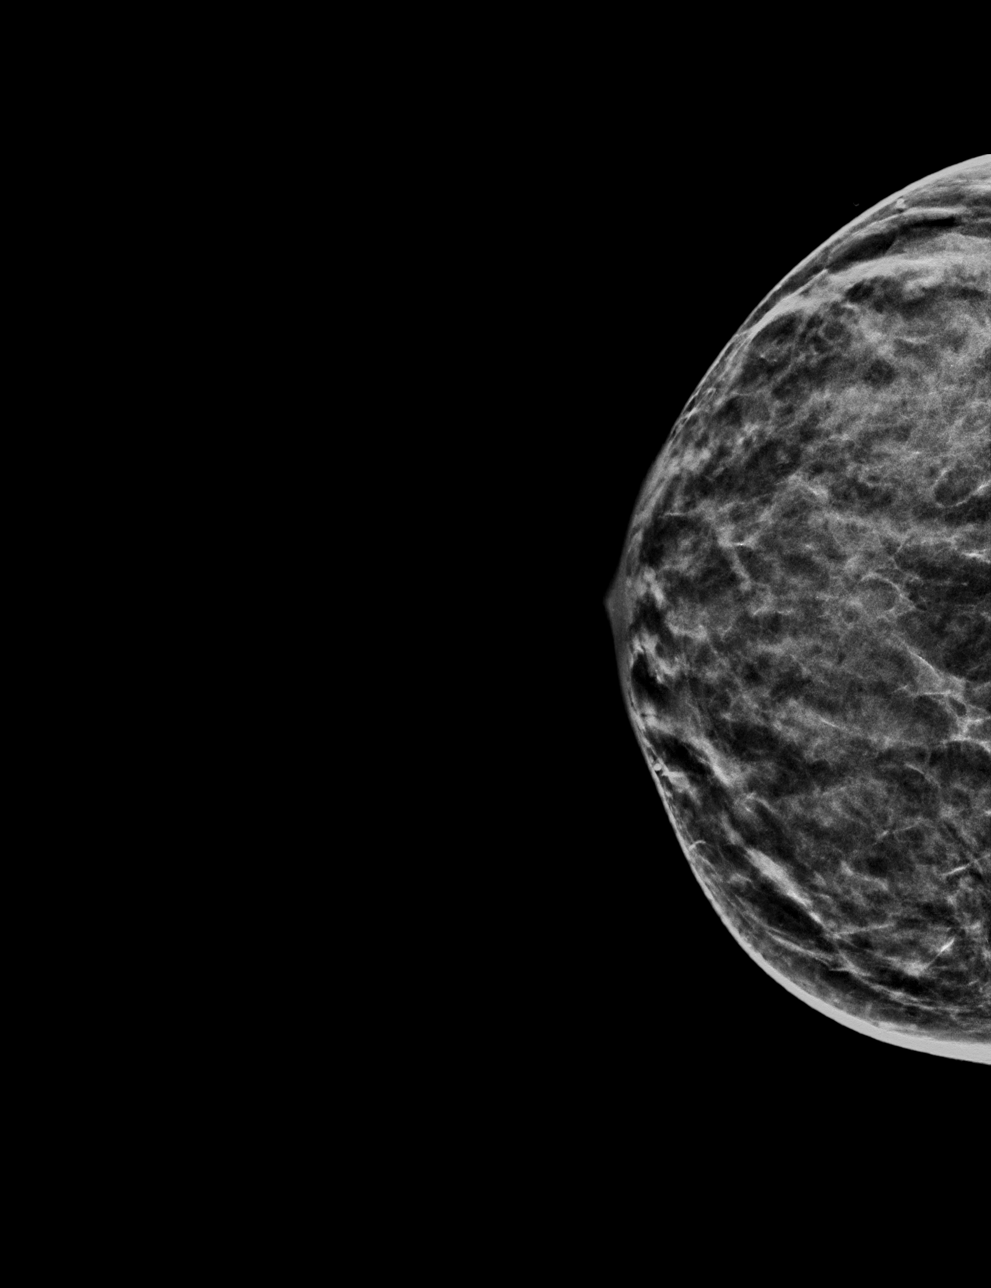

[L MLO synth-2D]
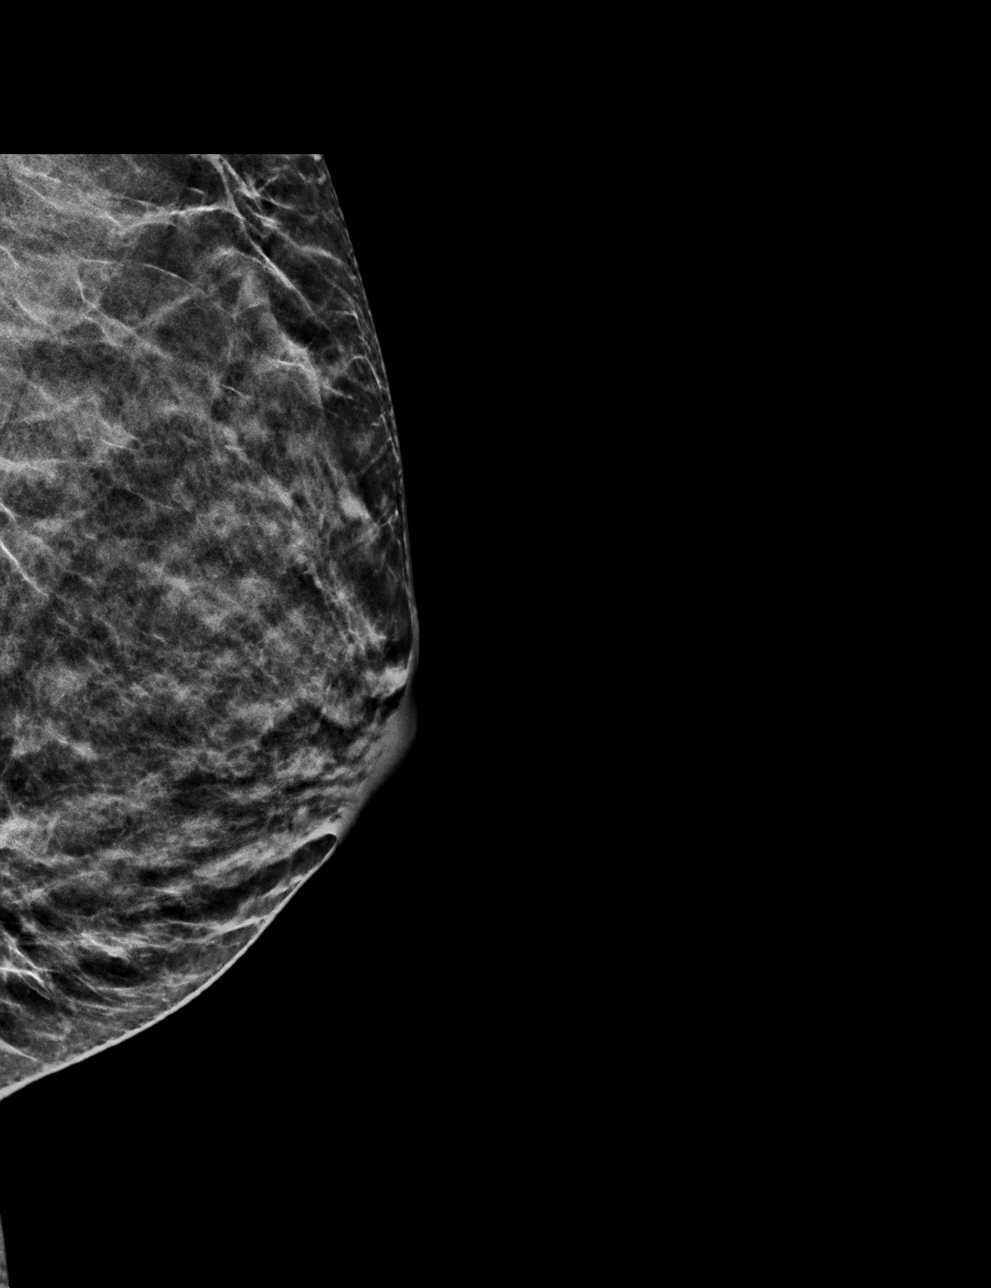

[L CC synth-2D]
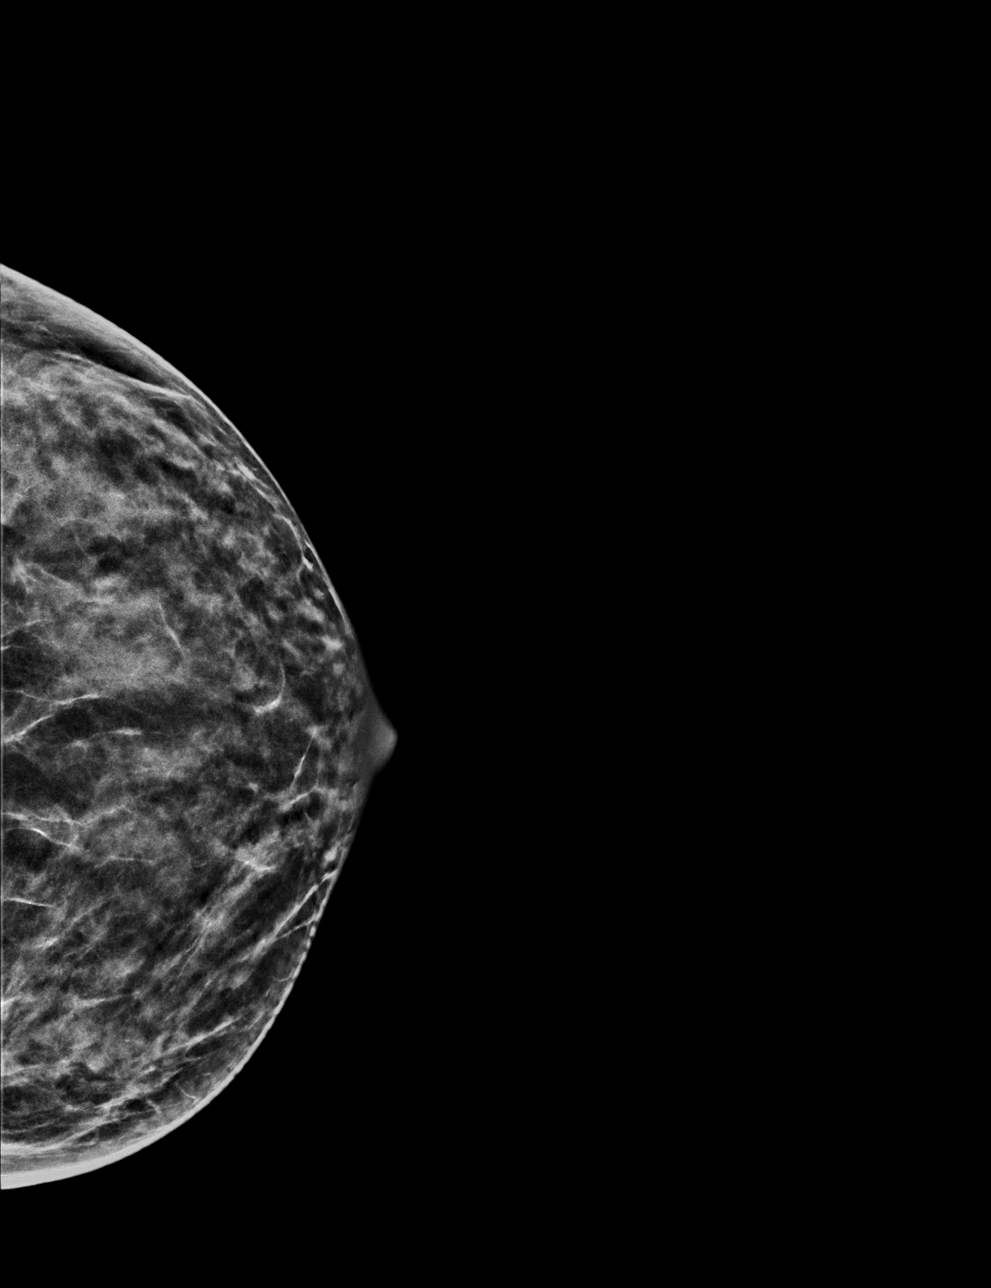

[R MLO synth-2D]
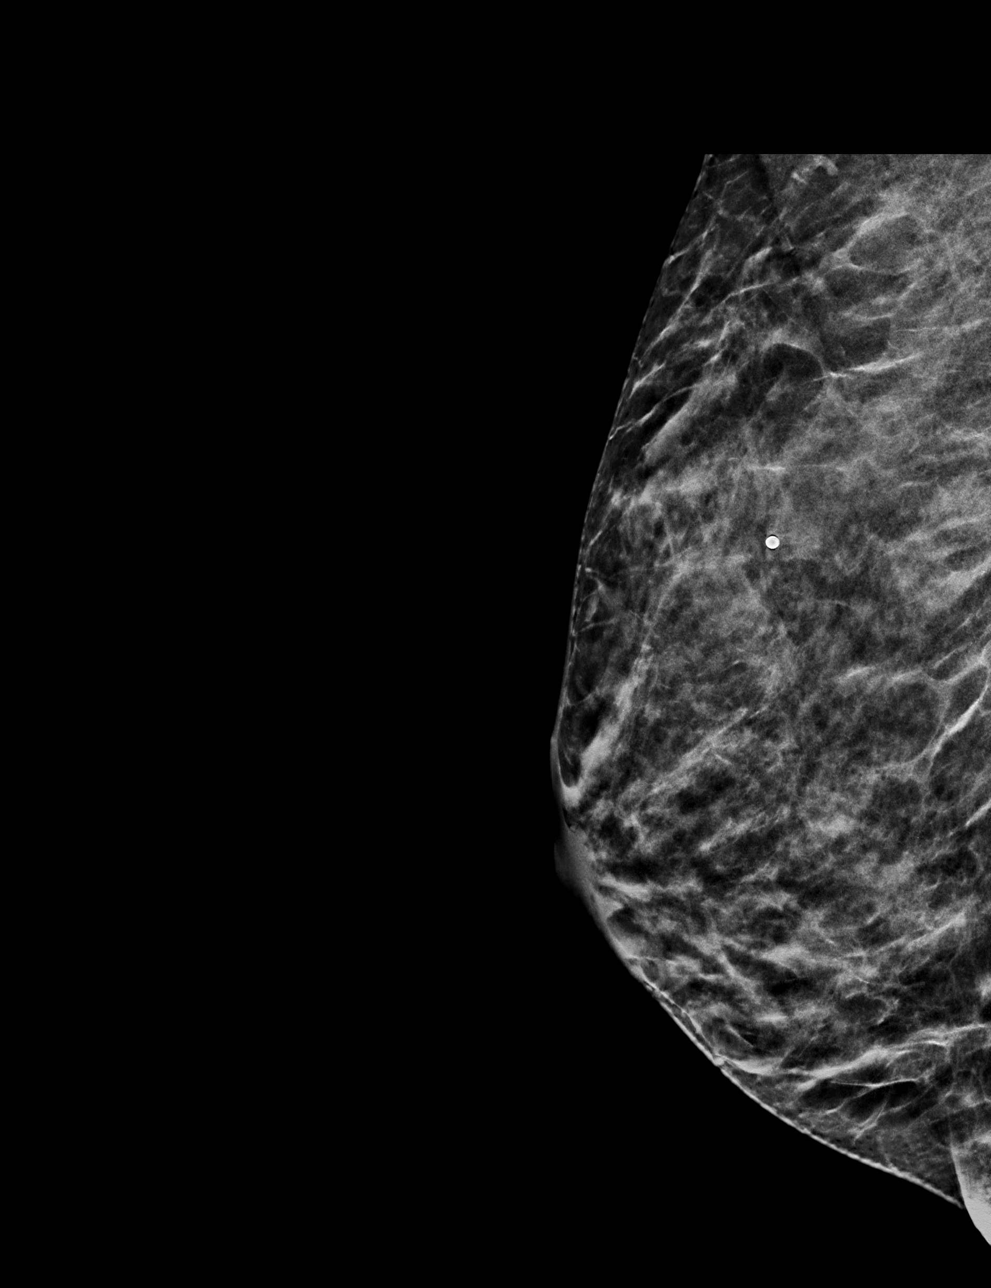

[4 of 29 positions shown; findings below may reference images not displayed]

ACR Breast Density Category d: The breast tissue is extremely dense,
which lowers the sensitivity of mammography.
FINDINGS: No mass, architectural distortion, or suspicious microcalcification
is identified to suggest malignancy in either breast. Spot
tangential view of the region of patient concern in the upper-outer
right breast is negative.

Mammographic images were processed with CAD.

On physical exam, I palpate movable glandular tissue in the
upper-outer quadrant right breast. I do not palpate discrete mass.

Targeted ultrasound is performed, showing dense fibroglandular
tissue containing a few scattered benign simple cysts. The largest
cyst seen is it 10 o'clock position 4 cm from the nipple measuring 7
mm maximum diameter. No solid mass or suspicious shadowing is
identified in the upper-outer quadrant of the right breast.
IMPRESSION: No evidence of malignancy in either breast. Dense glandular tissue
containing a few small cysts is seen in the region of patient
concern in the upper-outer quadrant of the right breast.

RECOMMENDATION:
Screening mammogram in one year.(Code:XP-[id number])

I have discussed the findings and recommendations with the patient.
If applicable, a reminder letter will be sent to the patient
regarding the next appointment.

BI-RADS CATEGORY  2: Benign.

## 2022-05-02 ENCOUNTER — Other Ambulatory Visit: Payer: Self-pay | Admitting: Nurse Practitioner

## 2022-05-02 DIAGNOSIS — Z1231 Encounter for screening mammogram for malignant neoplasm of breast: Secondary | ICD-10-CM

## 2022-05-02 NOTE — Progress Notes (Signed)
Patient is interested in obtaining screening mammogram via mobile mammogram bus at [facility]. No concerns at this time. Last mammogram: 2020. Order placed.  Mammogram bus scheduled for November 1st.

## 2022-05-03 ENCOUNTER — Encounter (INDEPENDENT_AMBULATORY_CARE_PROVIDER_SITE_OTHER): Payer: Self-pay | Admitting: *Deleted

## 2022-06-13 ENCOUNTER — Ambulatory Visit: Payer: PRIVATE HEALTH INSURANCE | Admitting: Nurse Practitioner

## 2022-06-13 ENCOUNTER — Encounter: Payer: Self-pay | Admitting: Nurse Practitioner

## 2022-06-13 VITALS — BP 120/84 | HR 79

## 2022-06-13 DIAGNOSIS — Z720 Tobacco use: Secondary | ICD-10-CM

## 2022-06-13 DIAGNOSIS — Z789 Other specified health status: Secondary | ICD-10-CM

## 2022-06-13 NOTE — Progress Notes (Signed)
Office Visit  Subjective:  Patient ID: Sheila Berg, female    DOB: 1970/10/22  Age: 51 y.o. MRN: [id number]  CC: Wellness exam   HPI Sheila Berg presents for wellness exam visit for insurance benefit.  Patient has a PCP: Wende Neighbors MD PMH significant for: HTN Last labs per PCP were completed: August 2023  Health Maintenance:  Colonoscopy: 2019, repeat 10 years Mammogram: 2020, mammogram order in place  Smoker: Current   Immunizations:  Shingrix-  Interested in this.  Tdap: 2019 COVID- x3  Lifestyle: Diet- no particular diet Exercise- does not exercise.     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   Irregular intermenstrual bleeding 02/08/2016   Migraine    Nexplanon in place 02/08/2016   Nexplanon insertion 07/31/2015   Inserted left arm 07/31/15    Trichimoniasis 02/12/2016    Past Surgical History:  Procedure Laterality Date   -     COLONOSCOPY N/A 04/21/2015   Procedure: COLONOSCOPY;  Surgeon: Rogene Houston, MD;  [facility];  Service: Endoscopy;  Laterality: N/A;  110 - moved to 1:20 - Ann to notify    COLONOSCOPY WITH PROPOFOL N/A 09/29/2017   Procedure: COLONOSCOPY WITH PROPOFOL;  Surgeon: Rogene Houston, MD;  [facility];  Service: Endoscopy;  Laterality: N/A;  7:30   ESOPHAGOGASTRODUODENOSCOPY N/A 04/21/2015   Procedure: ESOPHAGOGASTRODUODENOSCOPY (EGD);  Surgeon: Rogene Houston, MD;  [facility];  Service: Endoscopy;  Laterality: N/A;    Outpatient Medications Prior to Visit  Medication Sig Dispense Refill   amLODipine (NORVASC) 5 MG tablet Take 1 tablet every day by oral route.     ferrous sulfate 325 (65 FE) MG tablet Take 325 mg by mouth.      Vitamin D, Ergocalciferol, (DRISDOL) 1.25 MG (50000 UNIT) CAPS capsule Take 50,000 Units by mouth once a week.      cyclobenzaprine (FLEXERIL) 10 MG tablet Take 1 tablet (10 mg total) by mouth at bedtime. (Patient not taking: Reported on 01/04/2021) 15 tablet 0   predniSONE (STERAPRED UNI-PAK 21 TAB) 10 MG (21) TBPK tablet Take by mouth daily. Take 6 tabs by mouth daily  for 2 days, then 5 tabs for 2 days, then 4 tabs for 2 days, then 3 tabs for 2 days, 2 tabs for 2 days, then 1 tab by mouth daily for 2 days (Patient not taking: Reported on 01/04/2021) 42 tablet 0   No facility-administered medications prior to visit.    ROS Review of Systems  Respiratory:  Negative for shortness of breath.   Cardiovascular:  Negative for chest pain and leg swelling.  Neurological:  Negative for dizziness and headaches.    Objective:  BP 120/84   Pulse 79   SpO2 99%   Physical Exam Constitutional:      General: She is not in acute distress. HENT:     Head: Normocephalic.  Cardiovascular:     Rate and Rhythm: Normal rate and regular rhythm.     Heart sounds: Normal heart sounds.  Pulmonary:     Effort: Pulmonary effort is normal.     Breath sounds: Normal breath sounds.  Musculoskeletal:        General: Normal range of motion.     Right lower leg: No edema.     Left  lower leg: No edema.  Skin:    General: Skin is warm.  Neurological:     General: No focal deficit present.     Mental Status: She is alert and oriented to person, place, and time.  Psychiatric:        Mood and Affect: Mood normal.        Behavior: Behavior normal.      Assessment & Plan:    Diagnoses and all orders for this visit:  Participant in health and wellness plan Adult wellness physical was conducted today. Importance of diet and exercise were discussed in detail.  We reviewed immunizations and gave recommendations regarding current immunization needed for age. Patient would like Shingrix, will order this.  Preventative health exams needed: Mammogram- patient planning to complete this during Mobile Mammogram Event at [facility], Nov 1st.   Patient was advised yearly wellness exam  Tobacco Use: encouraged smoking cessation.    No orders of the defined types were placed in this encounter.   No orders of the defined types were placed in this encounter.   Follow-up: As needed.

## 2022-07-04 ENCOUNTER — Ambulatory Visit: Payer: PRIVATE HEALTH INSURANCE | Admitting: Nurse Practitioner

## 2022-07-04 DIAGNOSIS — Z23 Encounter for immunization: Secondary | ICD-10-CM

## 2022-07-04 NOTE — Progress Notes (Signed)
1st Dose of Shingrix given today on right deltoid. Lot: [id number], expiration date, 06/05/24.  CDC VIS given prior to injection.  Tolerated well. No immediate adverse reactions noted. Educated and discussed red flag symptoms and to call 911 or go to emergency room with signs of anaphylaxis.  Take OTC tylenol as needed for expected side effects such as muscle aches or fever.  Patient given the chance to ask all questions and discussed answers.  RTC in 2 months for 2nd dose of shingrix vaccine to complete series, or sooner as needed.

## 2022-07-10 ENCOUNTER — Ambulatory Visit
Admission: RE | Admit: 2022-07-10 | Discharge: 2022-07-10 | Disposition: A | Discharge status: Home / Self Care | Payer: PRIVATE HEALTH INSURANCE | Source: Ambulatory Visit | Attending: Nurse Practitioner | Admitting: Nurse Practitioner

## 2022-07-10 DIAGNOSIS — Z1231 Encounter for screening mammogram for malignant neoplasm of breast: Secondary | ICD-10-CM

## 2022-07-15 ENCOUNTER — Other Ambulatory Visit: Payer: Self-pay | Admitting: Nurse Practitioner

## 2022-07-15 DIAGNOSIS — R928 Other abnormal and inconclusive findings on diagnostic imaging of breast: Secondary | ICD-10-CM

## 2022-07-17 ENCOUNTER — Encounter: Payer: Self-pay | Admitting: Adult Health

## 2022-07-17 ENCOUNTER — Ambulatory Visit (INDEPENDENT_AMBULATORY_CARE_PROVIDER_SITE_OTHER): Payer: PRIVATE HEALTH INSURANCE | Admitting: Adult Health

## 2022-07-17 ENCOUNTER — Other Ambulatory Visit (HOSPITAL_COMMUNITY)
Admission: RE | Admit: 2022-07-17 | Discharge: 2022-07-17 | Disposition: A | Discharge status: Home / Self Care | Payer: PRIVATE HEALTH INSURANCE | Source: Ambulatory Visit | Attending: Adult Health | Admitting: Adult Health

## 2022-07-17 VITALS — BP 136/93 | HR 81 | Ht 68.0 in | Wt 119.0 lb

## 2022-07-17 DIAGNOSIS — Z720 Tobacco use: Secondary | ICD-10-CM

## 2022-07-17 DIAGNOSIS — Z01419 Encounter for gynecological examination (general) (routine) without abnormal findings: Secondary | ICD-10-CM | POA: Insufficient documentation

## 2022-07-17 DIAGNOSIS — I10 Essential (primary) hypertension: Secondary | ICD-10-CM

## 2022-07-17 DIAGNOSIS — R232 Flushing: Secondary | ICD-10-CM

## 2022-07-17 DIAGNOSIS — N951 Menopausal and female climacteric states: Secondary | ICD-10-CM

## 2022-07-17 DIAGNOSIS — Z1211 Encounter for screening for malignant neoplasm of colon: Secondary | ICD-10-CM | POA: Diagnosis not present

## 2022-07-17 DIAGNOSIS — R61 Generalized hyperhidrosis: Secondary | ICD-10-CM

## 2022-07-17 LAB — HEMOCCULT GUIAC POC 1CARD (OFFICE): Fecal Occult Blood, POC: NEGATIVE

## 2022-07-17 NOTE — Progress Notes (Signed)
Patient ID: Sheila Berg, female   DOB: July 17, 1971, 51 y.o.   MRN: [id number] History of Present Illness: Sheila Berg is a 51 year old black female,single, [id number], in for a well woman gyn exam and pap.  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  PCP is Dr Nevada Crane.   Current Medications, Allergies, Past Medical History, Past Surgical History, Family History and Social History were reviewed in [employer] record.     Review of Systems: Patient denies any headaches, hearing loss, fatigue, blurred vision, shortness of breath, chest pain, abdominal pain, problems with bowel movements, urination, or intercourse. No joint pain or mood swings.  Had period in February or March was almost a year since PMP +hot flashes and night sweats    Physical Exam:BP (!) 136/93 (BP Location: Right Arm, Patient Position: Sitting, Cuff Size: Normal)   Pulse 81   Ht '5\' 8"'$  (1.727 m)   Wt 119 lb (54 kg)   BMI 18.09 kg/m   General:  Well developed, well nourished, no acute distress Skin:  Warm and dry Neck:  Midline trachea, normal thyroid, good ROM, no lymphadenopathy Lungs; Clear to auscultation bilaterally Breast:  No dominant palpable mass, retraction, or nipple discharge Cardiovascular: Regular rate and rhythm Abdomen:  Soft, non tender, no hepatosplenomegaly Pelvic:  External genitalia is normal in appearance, no lesions.  The vagina is normal in appearance. Urethra has no lesions or masses. The cervix is bulbous.Pap with GC/CHL and HR HPV genotyping performed.   Uterus is felt to be normal size, shape, and contour.  No adnexal masses or tenderness noted.Bladder is non tender, no masses felt. Rectal: Good sphincter tone, no polyps, or hemorrhoids felt.  Hemoccult negative. Has scarring in rectal area Extremities/musculoskeletal:  No swelling or varicosities noted, no clubbing or cyanosis Psych:  No mood changes, alert and cooperative,seems happy AA is 1 Fall risk is low    07/17/2022     2:53 PM 10/16/2016    2:08 PM  Depression screen PHQ 2/9  Decreased Interest 0 0  Down, Depressed, Hopeless 0 0  PHQ - 2 Score 0 0  Altered sleeping 1   Tired, decreased energy 1   Change in appetite 0   Feeling bad or failure about yourself  0   Trouble concentrating 0   Moving slowly or fidgety/restless 0   Suicidal thoughts 0   PHQ-9 Score 2        07/17/2022    2:53 PM  GAD 7 : Generalized Anxiety Score  Nervous, Anxious, on Edge 0  Control/stop worrying 0  Worry too much - different things 0  Trouble relaxing 0  Restless 0  Easily annoyed or irritable 1  Afraid - awful might happen 0  Total GAD 7 Score 1    Upstream - 07/17/22 1452       Pregnancy Intention Screening   Does the patient want to become pregnant in the next year? No    Does the patient's partner want to become pregnant in the next year? No    Would the patient like to discuss contraceptive options today? No      Contraception Wrap Up   Current Method No Method - Other Reason    End Method No Method - Other Reason    Contraception Counseling Provided No            Examination chaperoned by Levy Pupa LPN    Impression and Plan: 1. Encounter for gynecological examination with  Papanicolaou smear of cervix Pap sent Pap in 3 years if normal Physical in 1 year Labs with PCP Had mammogram 07/10/22, needs follow up for asymmetry left breat, scheduled for 08/06/22. Had colonoscopy 2019  2. Encounter for screening fecal occult blood testing Hemoccult was negative   3. Hypertension, unspecified type She has not taken BP meds for today, she is on 5 mg Norvasc from PCP  4. Perimenopause Keep track of periods if goes over 366 days with no period and has bleeding let me know, will need [place] to assess   5. Hot flashes She declines meds   6. Night sweats   7. Tobacco use Will cut down, not ready to quit

## 2022-07-23 ENCOUNTER — Other Ambulatory Visit: Payer: Self-pay | Admitting: Adult Health

## 2022-07-23 ENCOUNTER — Telehealth: Payer: Self-pay

## 2022-07-23 LAB — CYTOLOGY - PAP
Adequacy: ABSENT
Chlamydia: NEGATIVE
Comment: NEGATIVE
Comment: NEGATIVE
Comment: NORMAL
Diagnosis: NEGATIVE
Diagnosis: REACTIVE
High risk HPV: NEGATIVE
Neisseria Gonorrhea: NEGATIVE

## 2022-07-23 MED ORDER — METRONIDAZOLE 500 MG PO TABS: 500.0000 mg | ORAL_TABLET | Freq: Two times a day (BID) | ORAL | 0 refills | Status: DC

## 2022-07-23 NOTE — Telephone Encounter (Signed)
Pt aware pap was negative for HPV, GC/CHL and malignancy but + for BV. Flagyl was sent to pharmacy. No sex or alcohol while taking med. Pt voiced understanding. [place]

## 2022-07-23 NOTE — Telephone Encounter (Signed)
Patient would like for someone to call her about her test results

## 2022-08-06 ENCOUNTER — Ambulatory Visit
Admission: RE | Admit: 2022-08-06 | Discharge: 2022-08-06 | Disposition: A | Discharge status: Home / Self Care | Payer: PRIVATE HEALTH INSURANCE | Source: Ambulatory Visit | Attending: Nurse Practitioner | Admitting: Nurse Practitioner

## 2022-08-06 DIAGNOSIS — R928 Other abnormal and inconclusive findings on diagnostic imaging of breast: Secondary | ICD-10-CM

## 2022-09-12 ENCOUNTER — Ambulatory Visit: Payer: PRIVATE HEALTH INSURANCE | Admitting: Nurse Practitioner

## 2022-09-12 DIAGNOSIS — Z23 Encounter for immunization: Secondary | ICD-10-CM

## 2022-09-12 NOTE — Progress Notes (Signed)
2nd Dose of Shingrix given today. Lot number: [id number], expires: 06/04/24.  Tolerated well. No immediate adverse reactions noted. Educated and discussed red flag symptoms and to call 911 or go to emergency room with signs of anaphylaxis.  Take OTC tylenol as needed for expected side effects such as muscle aches or fever.  Patient given the chance to ask all questions and discussed answers.  RTC  as needed.

## 2023-03-27 ENCOUNTER — Encounter: Payer: Self-pay | Admitting: Nurse Practitioner

## 2023-03-27 ENCOUNTER — Ambulatory Visit: Payer: PRIVATE HEALTH INSURANCE | Admitting: Nurse Practitioner

## 2023-03-27 VITALS — BP 121/88 | HR 70 | Temp 97.0°F | Ht 66.0 in | Wt 125.0 lb

## 2023-03-27 DIAGNOSIS — Z789 Other specified health status: Secondary | ICD-10-CM

## 2023-03-27 NOTE — Progress Notes (Signed)
[facility]  Subjective:  Patient ID: CHASTA DESHPANDE, female    DOB: Sep 16, 1970  Age: 52 y.o. MRN: [id number]  CC: Wellness Exam   HPI Priseis W Rohe presents for wellness exam visit for insurance benefit.  Patient has a PCP: Dwana Melena PMH significant for: HTN on amlodipine.   Last labs per PCP were completed: June 2024  Health Maintenance:  Colonoscopy: 2019, repeat in 10 years  Mammogram: November 2023, due to Nov 2024.   PAP: Nov 2023 Smoker: current smoker no plans to quit.  Immunizations:  Shingrix- Completed  Tdap: 2029  Lifestyle: Diet- No particular diet  Exercise- does not currently exercise, plans to start doing some exercises at work.    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   Irregular intermenstrual bleeding 02/08/2016   Migraine    Nexplanon in place 02/08/2016   Nexplanon insertion 07/31/2015   Inserted left arm 07/31/15    Trichimoniasis 02/12/2016    Past Surgical History:  Procedure Laterality Date   -     COLONOSCOPY N/A 04/21/2015   Procedure: COLONOSCOPY;  Surgeon: Malissa Hippo, MD;  [facility];  Service: Endoscopy;  Laterality: N/A;  110 - moved to 1:20 - Ann to notify    COLONOSCOPY WITH PROPOFOL N/A 09/29/2017   Procedure: COLONOSCOPY WITH PROPOFOL;  Surgeon: Malissa Hippo, MD;  [facility];  Service: Endoscopy;  Laterality: N/A;  7:30   ESOPHAGOGASTRODUODENOSCOPY N/A 04/21/2015   Procedure: ESOPHAGOGASTRODUODENOSCOPY (EGD);  Surgeon: Malissa Hippo, MD;  [facility];  Service: Endoscopy;  Laterality: N/A;    Outpatient Medications Prior to Visit  Medication Sig Dispense Refill   amLODipine (NORVASC) 5 MG tablet Take 1 tablet every day by oral route.     cholecalciferol (VITAMIN D3) 25 MCG (1000 UNIT) tablet Take 1,000 Units by mouth  daily.     ferrous sulfate 325 (65 FE) MG tablet Take 325 mg by mouth.  (Patient not taking: Reported on 03/27/2023)     metroNIDAZOLE (FLAGYL) 500 MG tablet Take 1 tablet (500 mg total) by mouth 2 (two) times daily. 14 tablet 0   Vitamin D, Ergocalciferol, (DRISDOL) 1.25 MG (50000 UNIT) CAPS capsule Take 50,000 Units by mouth once a week.     No facility-administered medications prior to visit.    ROS Review of Systems  Constitutional:  Negative for fatigue and unexpected weight change.  Respiratory:  Negative for shortness of breath.   Cardiovascular:  Negative for chest pain and leg swelling.  Gastrointestinal:  Negative for constipation, diarrhea, nausea and vomiting.  Endocrine: Negative for polydipsia, polyphagia and polyuria.  Musculoskeletal:  Negative for arthralgias and back pain.  Neurological:  Negative for dizziness and headaches.  Psychiatric/Behavioral:  Negative for sleep disturbance. The patient is not nervous/anxious.     Objective:  BP 121/88   Pulse 70   Temp (!) 97 F (36.1 C)   Ht 5\' 6"  (1.676 m)   Wt 125 lb (56.7 kg)   SpO2 99%   BMI 20.18 kg/m   Physical Exam Constitutional:      General: She is not in acute distress. HENT:     Head: Normocephalic.     Right Ear: Tympanic membrane, ear canal and external ear normal.     Left Ear: Tympanic  membrane and external ear normal.     Mouth/Throat:     Mouth: Mucous membranes are moist.     Pharynx: Oropharynx is clear. No oropharyngeal exudate or posterior oropharyngeal erythema.  Eyes:     Pupils: Pupils are equal, round, and reactive to light.  Cardiovascular:     Rate and Rhythm: Normal rate and regular rhythm.     Heart sounds: Normal heart sounds.  Pulmonary:     Effort: Pulmonary effort is normal.  Abdominal:     General: Abdomen is flat.     Palpations: Abdomen is soft.  Musculoskeletal:        General: Normal range of motion.     Right lower leg: No edema.     Left lower leg: No edema.   Skin:    General: Skin is warm.     Comments: Small quarter-sized lump noted on mid lower back. No erythema, non tender to pressures.   Neurological:     General: No focal deficit present.     Mental Status: She is alert and oriented to person, place, and time.  Psychiatric:        Mood and Affect: Mood normal.        Behavior: Behavior normal.      Assessment & Plan:    Laquasha was seen today for wellness exam.  Diagnoses and all orders for this visit:  Participant in health and wellness plan  Adult wellness physical was conducted today. Importance of diet and exercise were discussed in detail.  We reviewed immunizations and gave recommendations regarding current immunization needed for age.  Preventative health exams are up to date.   Patient was advised yearly wellness exam. Discussed follow-up with PCP for lump noted on lower back.   No orders of the defined types were placed in this encounter.   No orders of the defined types were placed in this encounter.   Follow-up: as needed

## 2023-07-23 ENCOUNTER — Other Ambulatory Visit (HOSPITAL_COMMUNITY)
Admission: RE | Admit: 2023-07-23 | Discharge: 2023-07-23 | Disposition: A | Discharge status: Home / Self Care | Payer: PRIVATE HEALTH INSURANCE | Source: Ambulatory Visit | Attending: Adult Health | Admitting: Adult Health

## 2023-07-23 ENCOUNTER — Encounter: Payer: Self-pay | Admitting: Adult Health

## 2023-07-23 ENCOUNTER — Ambulatory Visit: Payer: PRIVATE HEALTH INSURANCE | Admitting: Adult Health

## 2023-07-23 VITALS — BP 136/94 | HR 96 | Ht 68.0 in | Wt 128.0 lb

## 2023-07-23 DIAGNOSIS — Z1331 Encounter for screening for depression: Secondary | ICD-10-CM

## 2023-07-23 DIAGNOSIS — I10 Essential (primary) hypertension: Secondary | ICD-10-CM | POA: Diagnosis not present

## 2023-07-23 DIAGNOSIS — N898 Other specified noninflammatory disorders of vagina: Secondary | ICD-10-CM | POA: Diagnosis not present

## 2023-07-23 DIAGNOSIS — Z113 Encounter for screening for infections with a predominantly sexual mode of transmission: Secondary | ICD-10-CM | POA: Insufficient documentation

## 2023-07-23 DIAGNOSIS — Z1211 Encounter for screening for malignant neoplasm of colon: Secondary | ICD-10-CM

## 2023-07-23 DIAGNOSIS — Z01419 Encounter for gynecological examination (general) (routine) without abnormal findings: Secondary | ICD-10-CM

## 2023-07-23 DIAGNOSIS — Z720 Tobacco use: Secondary | ICD-10-CM

## 2023-07-23 DIAGNOSIS — K649 Unspecified hemorrhoids: Secondary | ICD-10-CM

## 2023-07-23 DIAGNOSIS — R232 Flushing: Secondary | ICD-10-CM

## 2023-07-23 LAB — HEMOCCULT GUIAC POC 1CARD (OFFICE): Fecal Occult Blood, POC: NEGATIVE

## 2023-07-23 NOTE — Progress Notes (Signed)
Patient ID: Sheila Berg, female   DOB: 1971-09-09, 52 y.o.   MRN: [id number] History of Present Illness: Sheila Berg is a 52 year old black female,single, PM in for well woman gyn exam. She had root canal recently and is on antibiotics. She has a discharge, but no itching now, but did have earlier. No period in over a year, having hot flashes.      Component Value Date/Time   DIAGPAP  07/17/2022 1456    - Negative for Intraepithelial Lesions or Malignancy (NILM)   DIAGPAP - Benign reactive/reparative changes 07/17/2022 1456   HPVHIGH Negative 07/17/2022 1456   ADEQPAP  07/17/2022 1456    Satisfactory for evaluation; transformation zone component ABSENT.    PCP is Dr Margo Aye.   Current Medications, Allergies, Past Medical History, Past Surgical History, Family History and Social History were reviewed in [employer] record.     Review of Systems: Patient denies any headaches, hearing loss, fatigue, blurred vision, shortness of breath, chest pain, abdominal pain, problems with bowel movements, urination, or intercourse. No joint pain or mood swings.  See HPI for positives    Physical Exam:BP (!) 136/94 (BP Location: Left Arm, Patient Position: Sitting, Cuff Size: Normal)   Pulse 96   Ht 5\' 8"  (1.727 m)   Wt 128 lb (58.1 kg)   LMP  (LMP Unknown) Comment: Over two years ago  BMI 19.46 kg/m   General:  Well developed, well nourished, no acute distress Skin:  Warm and dry Neck:  Midline trachea, normal thyroid, good ROM, no lymphadenopathy Lungs; Clear to auscultation bilaterally Breast:  No dominant palpable mass, retraction, or nipple discharge Cardiovascular: Regular rate and rhythm Abdomen:  Soft, non tender, no hepatosplenomegaly Pelvic:  External genitalia is normal in appearance, no lesions.  The vagina has creamy discahrge with slight order, CV swab obtained. Urethra has no lesions or masses. The cervix is smooth.  Uterus is felt to be normal size, shape, and  contour.  No adnexal masses or tenderness noted.Bladder is non tender, no masses felt. Rectal: Good sphincter tone, no polyps, + hemorrhoids felt.  Hemoccult negative. Extremities/musculoskeletal:  No swelling or varicosities noted, no clubbing or cyanosis Psych:  No mood changes, alert and cooperative,seems happy AA is 3 Fall risk is low    07/23/2023    9:18 AM 07/17/2022    2:53 PM 10/16/2016    2:08 PM  Depression screen PHQ 2/9  Decreased Interest 0 0 0  Down, Depressed, Hopeless 0 0 0  PHQ - 2 Score 0 0 0  Altered sleeping 0 1   Tired, decreased energy 1 1   Change in appetite 0 0   Feeling bad or failure about yourself  0 0   Trouble concentrating 0 0   Moving slowly or fidgety/restless 0 0   Suicidal thoughts 0 0   PHQ-9 Score 1 2        07/23/2023    9:19 AM 07/17/2022    2:53 PM  GAD 7 : Generalized Anxiety Score  Nervous, Anxious, on Edge 0 0  Control/stop worrying 0 0  Worry too much - different things 0 0  Trouble relaxing 0 0  Restless 1 0  Easily annoyed or irritable 0 1  Afraid - awful might happen 0 0  Total GAD 7 Score 1 1      Upstream - 07/23/23 0919       Pregnancy Intention Screening   Does the patient want to become pregnant  in the next year? No    Would the patient like to discuss contraceptive options today? No      Contraception Wrap Up   Current Method No Method - Other Reason    Reason for No Current Contraceptive Method at Intake ([facility] Only) Other   Menopausal   End Method No Method - Other Reason    Contraception Counseling Provided No            Examination chaperoned by Freddie Apley RN  Impression and Plan: 1. Encounter for well woman exam with routine gynecological exam Physical in 1 year Pap in 2026 Get mammogram scheduled  Colonoscopy per GI Labs with PCP Stay active  2. Encounter for screening fecal occult blood testing Hemoccult was negative  - POCT occult blood stool  3. Hypertension, unspecified type Taking  norvasc 5 mg Follow up with PCP  4. Vaginal discharge +creamy discharge with slight odor CV swab sent  - Cervicovaginal ancillary only( [place])  5. Screening examination for STD (sexually transmitted disease) CV swab sent for GC/CHL,trich,BV and yeast  - Cervicovaginal ancillary only( [place])  6. Tobacco use Try to decrease smoking   7. Hot flashes  8. Hemorrhoids, unspecified hemorrhoid type

## 2023-07-24 LAB — CERVICOVAGINAL ANCILLARY ONLY
Bacterial Vaginitis (gardnerella): POSITIVE — AB
Candida Glabrata: NEGATIVE
Candida Vaginitis: NEGATIVE
Chlamydia: NEGATIVE
Comment: NEGATIVE
Comment: NEGATIVE
Comment: NEGATIVE
Comment: NEGATIVE
Comment: NEGATIVE
Comment: NORMAL
Neisseria Gonorrhea: NEGATIVE
Trichomonas: POSITIVE — AB

## 2023-07-25 ENCOUNTER — Telehealth: Payer: Self-pay

## 2023-07-25 MED ORDER — METRONIDAZOLE 500 MG PO TABS: 500.0000 mg | ORAL_TABLET | Freq: Two times a day (BID) | ORAL | 0 refills | Status: DC

## 2023-07-25 NOTE — Telephone Encounter (Signed)
+  TRICH and BV on vaginal swab, will rx flagyl, no sex or alcohol while taking and needs proof of cure in about 2 weeks , can be self swab, and tell partner he needs to be treated too.

## 2023-07-25 NOTE — Telephone Encounter (Signed)
Spoke with patient she had seen her results already. Advised that Sheila Berg will send in abx this afternoon. Asked about partner treatment and patient said that she doesn't know what he wants to do. If patient wants partner treated by [place] she will call back and let us know. No other questions at this time.

## 2023-07-25 NOTE — Telephone Encounter (Signed)
Patient would like for someone to give her a call; she got her results and would like to ask a few questions.

## 2023-07-29 ENCOUNTER — Other Ambulatory Visit: Payer: Self-pay | Admitting: Adult Health

## 2023-08-12 ENCOUNTER — Ambulatory Visit (INDEPENDENT_AMBULATORY_CARE_PROVIDER_SITE_OTHER): Payer: Self-pay | Admitting: Adult Health

## 2023-08-12 ENCOUNTER — Encounter: Payer: Self-pay | Admitting: Adult Health

## 2023-08-12 VITALS — BP 127/95 | HR 104 | Ht 68.0 in | Wt 125.0 lb

## 2023-08-12 DIAGNOSIS — Z09 Encounter for follow-up examination after completed treatment for conditions other than malignant neoplasm: Secondary | ICD-10-CM

## 2023-08-12 DIAGNOSIS — A599 Trichomoniasis, unspecified: Secondary | ICD-10-CM

## 2023-08-12 DIAGNOSIS — I10 Essential (primary) hypertension: Secondary | ICD-10-CM

## 2023-08-12 DIAGNOSIS — N898 Other specified noninflammatory disorders of vagina: Secondary | ICD-10-CM

## 2023-08-12 LAB — POCT WET PREP (WET MOUNT): Clue Cells Wet Prep Whiff POC: NEGATIVE

## 2023-08-12 NOTE — Progress Notes (Signed)
  Subjective:     Patient ID: Sheila Berg, female   DOB: 17-Aug-1971, 52 y.o.   MRN: [id number]  HPI Sheila Berg is a 52 year old black female,single, PM in for proof of cure of recent trich and BV. She has not had sex since treatment.     Component Value Date/Time   DIAGPAP  07/17/2022 1456    - Negative for Intraepithelial Lesions or Malignancy (NILM)   DIAGPAP - Benign reactive/reparative changes 07/17/2022 1456   HPVHIGH Negative 07/17/2022 1456   ADEQPAP  07/17/2022 1456    Satisfactory for evaluation; transformation zone component ABSENT.    PCP Dr Margo Aye  Review of Systems Has discharge Reviewed past medical,surgical, social and family history. Reviewed medications and allergies.     Objective:   Physical Exam BP (!) 127/95 (BP Location: Left Arm, Patient Position: Sitting, Cuff Size: Normal)   Pulse (!) 104   Ht 5\' 8"  (1.727 m)   Wt 125 lb (56.7 kg)   LMP  (LMP Unknown) Comment: Over two years ago  BMI 19.01 kg/m     Skin warm and dry.Pelvic: external genitalia is normal in appearance no lesions, vagina: creamy discharge without odor,urethra has no lesions or masses noted, cervix:smooth and bulbous, uterus: normal size, shape and contour, non tender, no masses felt, adnexa: no masses or tenderness noted. Bladder is non tender and no masses felt. Wet prep:negative for BV and Trich   Upstream - 08/12/23 1138       Pregnancy Intention Screening   Does the patient want to become pregnant in the next year? N/A    Does the patient's partner want to become pregnant in the next year? N/A    Would the patient like to discuss contraceptive options today? N/A      Contraception Wrap Up   Current Method --   PM   End Method --   PM   Contraception Counseling Provided No    How was the end contraceptive method provided? N/A            Examination chaperoned by Maury Dus RN  Assessment:     1. Vaginal discharge Creamy discharge, no odor - POCT Wet Prep [facility])  2.  Trichomoniasis No trich or BV on wet prep - POCT Wet Prep [facility])  3. Hypertension, unspecified type Take meds and follow up with PCP  4. Follow-up exam after treatment Negative trich on wet prep    Plan:     Follow up prn

## 2024-03-16 ENCOUNTER — Other Ambulatory Visit: Payer: Self-pay | Admitting: Nurse Practitioner

## 2024-03-16 DIAGNOSIS — Z1231 Encounter for screening mammogram for malignant neoplasm of breast: Secondary | ICD-10-CM

## 2024-03-16 NOTE — Progress Notes (Signed)
 Patient is interested in obtaining screening mammogram via mobile mammogram bus at [facility]. No concerns at this time. Last mammogram:11/23  Order placed.

## 2024-03-25 ENCOUNTER — Encounter: Payer: Self-pay | Admitting: Nurse Practitioner

## 2024-03-25 ENCOUNTER — Ambulatory Visit: Payer: Self-pay | Admitting: Nurse Practitioner

## 2024-03-25 VITALS — BP 120/72 | HR 96 | Temp 97.0°F | Ht 66.0 in | Wt 135.0 lb

## 2024-03-25 DIAGNOSIS — Z789 Other specified health status: Secondary | ICD-10-CM

## 2024-03-25 NOTE — Progress Notes (Signed)
 Occupational [facility]- [facility]  Subjective:  Patient ID: Sheila Berg, female    DOB: July 31, 1971  Age: 53 y.o. MRN: [id number]  CC: Wellness exam    HPI Sheila Berg presents for wellness exam visit for insurance benefit.  Patient has a PCP: Dr. Shona PMH significant for: HTN, controlled on amlodipine.  Last labs per PCP were completed: June 2025  Health Maintenance:  Colonoscopy: 2019, 10 year call back Mammogram: due for this, scheduled for next week 03/31/24 Pap: 2023, negative.     Smoker: half a pack a day since age 44  Immunizations:  Shingrix-  completed. Flu-  declines  Tdap; 2019   Lifestyle: Diet- monitors carbs.  Exercise- no exercise but very active at work.      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   Irregular intermenstrual bleeding 02/08/2016   Migraine    Nexplanon in place 02/08/2016   Nexplanon insertion 07/31/2015   Inserted left arm 07/31/15    Trichimoniasis 02/12/2016    Past Surgical History:  Procedure Laterality Date   -     COLONOSCOPY N/A 04/21/2015   Procedure: COLONOSCOPY;  Surgeon: Claudis RAYMOND Rivet, MD;  Location: [facility];  Service: Endoscopy;  Laterality: N/A;  110 - moved to 1:20 - Ann to notify    COLONOSCOPY WITH PROPOFOL  N/A 09/29/2017   Procedure: COLONOSCOPY WITH PROPOFOL ;  Surgeon: Rivet Claudis RAYMOND, MD;  Location: [facility];  Service: Endoscopy;  Laterality: N/A;  7:30   ESOPHAGOGASTRODUODENOSCOPY N/A 04/21/2015   Procedure: ESOPHAGOGASTRODUODENOSCOPY (EGD);  Surgeon: Claudis RAYMOND Rivet, MD;  Location: [facility];  Service: Endoscopy;  Laterality: N/A;   MOUTH SURGERY     Tooth 19 removed and crown placed, bone graft, was infected    Outpatient Medications Prior to Visit  Medication Sig Dispense Refill   amLODipine (NORVASC) 5 MG tablet Take 1 tablet every day  by oral route.     cholecalciferol (VITAMIN D3) 25 MCG (1000 UNIT) tablet Take 4,000 Units by mouth daily.     ferrous sulfate  325 (65 FE) MG tablet Take 325 mg by mouth.     triamcinolone cream (KENALOG) 0.1 % APPLY A THIN LAYER TO THE AFFECTED AREA(S) BY TOPICAL ROUTE 2 TIMES PER DAY     No facility-administered medications prior to visit.    ROS Review of Systems  HENT:  Negative for hearing loss.   Eyes:  Negative for visual disturbance.  Respiratory:  Negative for shortness of breath.   Cardiovascular:  Negative for chest pain and leg swelling.  Gastrointestinal:  Negative for constipation, diarrhea, nausea and vomiting.  Musculoskeletal:  Positive for back pain (upper back, pulled muscle).  Neurological:  Negative for dizziness and headaches.  Psychiatric/Behavioral:  Negative for sleep disturbance.     Objective:  BP 120/72 (BP Location: Left Arm, Patient Position: Sitting, Cuff Size: Normal)   Pulse 96   Temp (!) 97 F (36.1 C) (Temporal)   Ht 5' 6 (1.676 m)   Wt 135 lb (61.2 kg)   LMP  (LMP Unknown) Comment: Over two years ago  SpO2 100%   BMI 21.79 kg/m   Physical Exam Constitutional:      General: She is not in acute distress. HENT:     Head: Normocephalic.     Right Ear: Tympanic membrane,  ear canal and external ear normal.     Left Ear: Tympanic membrane, ear canal and external ear normal.     Mouth/Throat:     Mouth: Mucous membranes are moist.     Pharynx: Oropharynx is clear. No oropharyngeal exudate.  Cardiovascular:     Rate and Rhythm: Normal rate and regular rhythm.     Heart sounds: Normal heart sounds.  Pulmonary:     Effort: Pulmonary effort is normal.     Breath sounds: Normal breath sounds.  Abdominal:     General: Abdomen is flat. Bowel sounds are normal.     Palpations: Abdomen is soft.  Musculoskeletal:        General: Normal range of motion.     Right lower leg: No edema.     Left lower leg: No edema.  Neurological:     General: No  focal deficit present.     Mental Status: She is alert and oriented to person, place, and time.  Psychiatric:        Mood and Affect: Mood normal.        Behavior: Behavior normal.      Assessment & Plan:    Sheila Berg was seen today for wellness exam .  Diagnoses and all orders for this visit:  Participant in health and wellness plan Adult wellness physical was conducted today. Importance of diet and exercise were discussed in detail.  We reviewed immunizations and gave recommendations regarding current immunization needed for age.  Preventative health exams needed: mammogram already scheduled for next week.  Encouraged smoking cessation. Patient has no plans of quitting at this time.   Patient was advised yearly wellness exam and follow-up with PCP as scheduled.     No orders of the defined types were placed in this encounter.   No orders of the defined types were placed in this encounter.   Follow-up: as needed.

## 2024-03-31 ENCOUNTER — Ambulatory Visit
Admission: RE | Admit: 2024-03-31 | Discharge: 2024-03-31 | Disposition: A | Discharge status: Home / Self Care | Payer: PRIVATE HEALTH INSURANCE | Source: Ambulatory Visit | Attending: Nurse Practitioner | Admitting: Nurse Practitioner

## 2024-03-31 DIAGNOSIS — Z1231 Encounter for screening mammogram for malignant neoplasm of breast: Secondary | ICD-10-CM

## 2024-07-10 HISTORY — PX: OTHER SURGICAL HISTORY: SHX169

## 2024-08-09 ENCOUNTER — Ambulatory Visit: Payer: PRIVATE HEALTH INSURANCE | Admitting: Adult Health

## 2024-08-09 ENCOUNTER — Encounter: Payer: Self-pay | Admitting: Adult Health

## 2024-08-09 VITALS — BP 135/91 | HR 87 | Ht 68.0 in | Wt 136.0 lb

## 2024-08-09 DIAGNOSIS — L732 Hidradenitis suppurativa: Secondary | ICD-10-CM | POA: Insufficient documentation

## 2024-08-09 DIAGNOSIS — I10 Essential (primary) hypertension: Secondary | ICD-10-CM

## 2024-08-09 DIAGNOSIS — Z01419 Encounter for gynecological examination (general) (routine) without abnormal findings: Secondary | ICD-10-CM

## 2024-08-09 DIAGNOSIS — L0292 Furuncle, unspecified: Secondary | ICD-10-CM

## 2024-08-09 MED ORDER — DOXYCYCLINE HYCLATE 100 MG PO TABS: 100.0000 mg | ORAL_TABLET | Freq: Two times a day (BID) | ORAL | 0 refills | Status: AC

## 2024-08-09 NOTE — Progress Notes (Signed)
 Patient ID: Sheila Berg, female   DOB: Dec 24, 1970, 53 y.o.   MRN: [id number] History of Present Illness: Therese is a 53 year old black female, single, [id number] in for a well woman gyn exam. Has ?boil right buttock.      Component Value Date/Time   DIAGPAP  07/17/2022 1456    - Negative for Intraepithelial Lesions or Malignancy (NILM)   DIAGPAP - Benign reactive/reparative changes 07/17/2022 1456   HPVHIGH Negative 07/17/2022 1456   ADEQPAP  07/17/2022 1456    Satisfactory for evaluation; transformation zone component ABSENT.    PCP is Dr Shona  Current Medications, Allergies, Past Medical History, Past Surgical History, Family History and Social History were reviewed in [employer] electronic medical record.     Review of Systems: Patient denies any headaches, hearing loss, fatigue, blurred vision, shortness of breath, chest pain, abdominal pain, problems with bowel movements(constipated at times, uses miralax ), urination, or intercourse. No joint pain or mood swings. Denies any vaginal bleeding  ?boil right buttock   Physical Exam:BP (!) 135/91 (BP Location: Right Arm, Patient Position: Sitting, Cuff Size: Normal)   Pulse 87   Ht 5' 8 (1.727 m)   Wt 136 lb (61.7 kg)   LMP  (LMP Unknown) Comment: Over two years ago  BMI 20.68 kg/m   General:  Well developed, well nourished, no acute distress Skin:  Warm and dry Neck:  Midline trachea, normal thyroid, good ROM, no lymphadenopathy Lungs; Clear to auscultation bilaterally Breast:  No dominant palpable mass, retraction, or nipple discharge, has accessory nipple at bra line on left. Cardiovascular: Regular rate and rhythm Abdomen:  Soft, non tender, no hepatosplenomegaly Pelvic:  External genitalia is normal in appearance, has scarring, has  1 cm boil right buttock.  The vagina is normal in appearance. Urethra has no lesions or masses. The cervix is bulbous.  Uterus is felt to be normal size, shape, and contour.  No adnexal masses  or tenderness noted.Bladder is non tender, no masses felt. Rectal: Deferred  Extremities/musculoskeletal:  No swelling or varicosities noted, no clubbing or cyanosis Psych:  No mood changes, alert and cooperative,seems happy AA is 3 Fall risk is low    08/09/2024   11:49 AM 07/23/2023    9:18 AM 07/17/2022    2:53 PM  Depression screen PHQ 2/9  Decreased Interest 0 0 0  Down, Depressed, Hopeless 0 0 0  PHQ - 2 Score 0 0 0  Altered sleeping 0 0 1  Tired, decreased energy 0 1 1  Change in appetite 0 0 0  Feeling bad or failure about yourself  0 0 0  Trouble concentrating 0 0 0  Moving slowly or fidgety/restless 0 0 0  Suicidal thoughts 0 0 0  PHQ-9 Score 0 1  2      Data saved with a previous flowsheet row definition       08/09/2024   11:50 AM 07/23/2023    9:19 AM 07/17/2022    2:53 PM  GAD 7 : Generalized Anxiety Score  Nervous, Anxious, on Edge 0 0 0  Control/stop worrying 0 0 0  Worry too much - different things 0 0 0  Trouble relaxing 0 0 0  Restless 0 1 0  Easily annoyed or irritable 0 0 1  Afraid - awful might happen 0 0 0  Total GAD 7 Score 0 1 1    Upstream - 08/09/24 1148       Pregnancy Intention Screening   Does  the patient want to become pregnant in the next year? N/A    Does the patient's partner want to become pregnant in the next year? N/A    Would the patient like to discuss contraceptive options today? N/A      Contraception Wrap Up   Current Method Post-Menopause    End Method Post-Menopause    Contraception Counseling Provided No           Examination chaperoned by Clarita Salt LPN  Impression and plan: 1. Encounter for well woman exam with routine gynecological exam (Primary) Pap and physical in 1 year Labs with PCP Mammogram was negative 03/31/24 Colonoscopy per GI  2. Boil Will rx doxycycline  Meds ordered this encounter  Medications   doxycycline (VIBRA-TABS) 100 MG tablet    Sig: Take 1 tablet (100 mg total) by mouth 2 (two)  times daily.    Dispense:  20 tablet    Refill:  0    Supervising Provider:   JAYNE, LUTHER H [[id number]]     3. Hidradenitis suppurativa of anogenital region  4. Hypertension, unspecified type Continue Norvasc 5 mg 1 daily and follow up with PCP
